# Patient Record
Sex: Female | Born: 1950 | Race: White | Hispanic: No | Marital: Single | Smoking: Never smoker
Health system: Southern US, Community
[De-identification: ages and names within clinical notes are randomized; demographics above are authoritative.]

## PROBLEM LIST (undated history)

## (undated) ENCOUNTER — Emergency Department (HOSPITAL_COMMUNITY)

## (undated) DIAGNOSIS — C4491 Basal cell carcinoma of skin, unspecified: Secondary | ICD-10-CM

## (undated) DIAGNOSIS — Z464 Encounter for fitting and adjustment of orthodontic device: Secondary | ICD-10-CM

## (undated) DIAGNOSIS — F419 Anxiety disorder, unspecified: Secondary | ICD-10-CM

## (undated) DIAGNOSIS — Z86018 Personal history of other benign neoplasm: Secondary | ICD-10-CM

## (undated) DIAGNOSIS — Z973 Presence of spectacles and contact lenses: Secondary | ICD-10-CM

## (undated) DIAGNOSIS — R7303 Prediabetes: Secondary | ICD-10-CM

## (undated) DIAGNOSIS — M722 Plantar fascial fibromatosis: Secondary | ICD-10-CM

## (undated) DIAGNOSIS — IMO0001 Reserved for inherently not codable concepts without codable children: Secondary | ICD-10-CM

## (undated) DIAGNOSIS — I10 Essential (primary) hypertension: Secondary | ICD-10-CM

## (undated) DIAGNOSIS — T7840XA Allergy, unspecified, initial encounter: Secondary | ICD-10-CM

## (undated) DIAGNOSIS — C44612 Basal cell carcinoma of skin of right upper limb, including shoulder: Secondary | ICD-10-CM

## (undated) DIAGNOSIS — K589 Irritable bowel syndrome without diarrhea: Secondary | ICD-10-CM

## (undated) DIAGNOSIS — M8580 Other specified disorders of bone density and structure, unspecified site: Secondary | ICD-10-CM

## (undated) HISTORY — PX: UPPER GI ENDOSCOPY: SHX6162

## (undated) HISTORY — DX: Plantar fascial fibromatosis: M72.2

## (undated) HISTORY — PX: OTHER SURGICAL HISTORY: SHX169

## (undated) HISTORY — DX: Basal cell carcinoma of skin, unspecified: C44.91

## (undated) HISTORY — PX: COLONOSCOPY: SHX174

## (undated) HISTORY — DX: Personal history of other benign neoplasm: Z86.018

## (undated) HISTORY — DX: Allergy, unspecified, initial encounter: T78.40XA

## (undated) HISTORY — DX: Irritable bowel syndrome, unspecified: K58.9

## (undated) HISTORY — DX: Other specified disorders of bone density and structure, unspecified site: M85.80

## (undated) HISTORY — PX: EYE SURGERY: SHX253

## (undated) HISTORY — DX: Basal cell carcinoma of skin of right upper limb, including shoulder: C44.612

---

## 2005-04-23 ENCOUNTER — Encounter: Payer: Self-pay | Admitting: Rheumatology

## 2005-04-29 ENCOUNTER — Encounter: Payer: Self-pay | Admitting: Rheumatology

## 2006-09-29 HISTORY — PX: RHINOPLASTY: SUR1284

## 2007-01-28 HISTORY — PX: FRACTURE SURGERY: SHX138

## 2007-02-15 ENCOUNTER — Emergency Department: Payer: Self-pay | Admitting: Emergency Medicine

## 2007-03-29 ENCOUNTER — Ambulatory Visit: Payer: Self-pay | Admitting: Otolaryngology

## 2007-04-29 ENCOUNTER — Ambulatory Visit: Payer: Self-pay | Admitting: Otolaryngology

## 2008-04-27 DIAGNOSIS — C44612 Basal cell carcinoma of skin of right upper limb, including shoulder: Secondary | ICD-10-CM

## 2008-04-27 HISTORY — DX: Basal cell carcinoma of skin of right upper limb, including shoulder: C44.612

## 2009-06-21 DIAGNOSIS — Z86018 Personal history of other benign neoplasm: Secondary | ICD-10-CM

## 2009-06-21 HISTORY — DX: Personal history of other benign neoplasm: Z86.018

## 2010-11-30 ENCOUNTER — Ambulatory Visit: Payer: Self-pay | Admitting: Internal Medicine

## 2011-05-13 ENCOUNTER — Ambulatory Visit: Payer: Self-pay

## 2011-06-13 ENCOUNTER — Ambulatory Visit: Payer: Self-pay | Admitting: Internal Medicine

## 2014-03-20 ENCOUNTER — Ambulatory Visit: Payer: Self-pay | Admitting: Surgery

## 2014-03-20 HISTORY — PX: BREAST BIOPSY: SHX20

## 2014-03-21 LAB — PATHOLOGY REPORT

## 2014-04-06 ENCOUNTER — Ambulatory Visit: Payer: Self-pay | Admitting: Unknown Physician Specialty

## 2014-04-07 LAB — PATHOLOGY REPORT

## 2014-07-13 DIAGNOSIS — M791 Myalgia, unspecified site: Secondary | ICD-10-CM | POA: Insufficient documentation

## 2015-02-23 ENCOUNTER — Other Ambulatory Visit: Payer: Self-pay | Admitting: Unknown Physician Specialty

## 2015-02-23 DIAGNOSIS — Z1231 Encounter for screening mammogram for malignant neoplasm of breast: Secondary | ICD-10-CM

## 2015-03-19 ENCOUNTER — Ambulatory Visit
Admission: RE | Admit: 2015-03-19 | Discharge: 2015-03-19 | Disposition: A | Discharge status: Home / Self Care | Payer: BC Managed Care – PPO | Source: Ambulatory Visit | Attending: Unknown Physician Specialty | Admitting: Unknown Physician Specialty

## 2015-03-19 DIAGNOSIS — Z1231 Encounter for screening mammogram for malignant neoplasm of breast: Secondary | ICD-10-CM | POA: Insufficient documentation

## 2015-03-27 ENCOUNTER — Telehealth: Payer: Self-pay | Admitting: *Deleted

## 2015-03-27 NOTE — Telephone Encounter (Signed)
Patient called. pt inquired what to expect for tomorrow's NP visit. Explained to patient that Dr. Theora Gianotti will evaluate her with a gyn examination to determine the cause of her vulvar pain. The patient asked if she would be undergoing a biopsy in the clinic tomorrow and have anesthesia. I explained to the patient that we must examine the patient first to determine if a biopsy is even warranted.  However, I reassured the patient will not have anesthesia tomorrow.  Pt reassured. Support and active listening provided.

## 2015-03-28 ENCOUNTER — Other Ambulatory Visit: Payer: Self-pay | Admitting: Family Medicine

## 2015-03-28 ENCOUNTER — Inpatient Hospital Stay: Payer: BC Managed Care – PPO | Attending: Obstetrics and Gynecology | Admitting: Obstetrics and Gynecology

## 2015-03-28 VITALS — BP 139/82 | HR 67 | Temp 97.7°F | Resp 18 | Ht 62.0 in | Wt 123.0 lb

## 2015-03-28 DIAGNOSIS — N94819 Vulvodynia, unspecified: Secondary | ICD-10-CM | POA: Diagnosis not present

## 2015-03-28 DIAGNOSIS — R102 Pelvic and perineal pain unspecified side: Secondary | ICD-10-CM | POA: Insufficient documentation

## 2015-03-28 DIAGNOSIS — M8580 Other specified disorders of bone density and structure, unspecified site: Secondary | ICD-10-CM | POA: Diagnosis not present

## 2015-03-28 DIAGNOSIS — Z85828 Personal history of other malignant neoplasm of skin: Secondary | ICD-10-CM | POA: Diagnosis not present

## 2015-03-28 DIAGNOSIS — N9089 Other specified noninflammatory disorders of vulva and perineum: Secondary | ICD-10-CM | POA: Diagnosis not present

## 2015-03-28 DIAGNOSIS — Z79899 Other long term (current) drug therapy: Secondary | ICD-10-CM | POA: Diagnosis not present

## 2015-03-28 MED ORDER — LIDOCAINE HCL 2 % EX GEL: 1.0000 "application " | CUTANEOUS | Status: DC | PRN

## 2015-03-28 MED ORDER — LIDOCAINE 5 % EX OINT: 1.0000 "application " | TOPICAL_OINTMENT | Freq: Four times a day (QID) | CUTANEOUS | Status: DC | PRN

## 2015-03-28 MED ORDER — ESTRADIOL 0.1 MG/GM VA CREA: TOPICAL_CREAM | VAGINAL | Status: DC

## 2015-03-28 MED ORDER — CEPHALEXIN 500 MG PO CAPS: 500.0000 mg | ORAL_CAPSULE | Freq: Four times a day (QID) | ORAL | Status: AC

## 2015-03-28 NOTE — Progress Notes (Signed)
Gynecologic Oncology Interval Note  Referring Provider: Verlene Mayer, MD  Chief Concern: Vulvar pain  Subjective:  Tiffany [name] is a 64 y.o. woman who presents today for vulvar pain. She was seen by Dr. Laurey Morale who did a Pap and vaginal swab. Her referred her for further evaluation.   She has tried diflucan, premarin cream (stopped due to breast soreness), vaseline, Replens, soaking in baking soda, vitamin E topical, and A&D ointment.   Problem List: Patient Active Problem List   Diagnosis Date Noted  . Vulvar pain 03/28/2015    Past Medical History: Past Medical History  Diagnosis Date  . Basal cell carcinoma   . Osteopenia   . Allergy   . Plantar fasciitis     Past Surgical History: Past Surgical History  Procedure Laterality Date  . Breast biopsy Right     negative  . Rhinoplasty  2008    For fractured nose    Family History: Family History  Problem Relation Age of Onset  . Colon cancer Mother   . Hypertension Mother   . Basal cell carcinoma Mother   . Diabetes Father   . Hypertension Father   . Heart disease Father   . Alzheimer's disease Father     Social History: History   Social History  . Marital Status: Single    Spouse Name: N/A  . Number of Children: N/A  . Years of Education: N/A   Occupational History  . Not on file.   Social History Main Topics  . Smoking status: Never Smoker   . Smokeless tobacco: Never Used  . Alcohol Use: No  . Drug Use: No  . Sexual Activity: No   Other Topics Concern  . Not on file   Social History Narrative   Past OB/GYN history: G0P0. Postmenopausal.   Allergies: Allergies  Allergen Reactions  . Prednisone Other (See Comments)    Body aches with oral prednisone  . Sulfa Antibiotics Nausea Only    Current Medications: Current Outpatient Prescriptions  Medication Sig Dispense Refill  . ALPRAZolam (XANAX) 0.25 MG tablet Take 0.5 tablets by mouth as needed.    . Ascorbic Acid (VITAMIN C) 1000  MG tablet Take 1 tablet by mouth daily.    . Biotin 1000 MCG tablet Take 1 tablet by mouth daily.    Marland Kitchen bismuth subsalicylate (BISMATROL) 262 MG/15ML suspension Take by mouth as needed.    . calcium citrate-vitamin D (CITRACAL+D) 315-200 MG-UNIT per tablet Take by mouth.    . cephALEXin (KEFLEX) 500 MG capsule Take 1 capsule (500 mg total) by mouth 4 (four) times daily. 8 capsule 0  . Clocortolone Pivalate (CLODERM) 0.1 % cream Apply topically as needed.    . diphenhydrAMINE (BENADRYL) 25 mg capsule Take 1 capsule by mouth as needed.    . fluconazole (DIFLUCAN) 150 MG tablet Take 1 tablet by mouth as needed.    . magnesium 30 MG tablet Take 250 mg by mouth 1 day or 1 dose.    . Multiple Vitamin (MULTIVITAMIN) capsule Take 1 capsule by mouth daily.    . naproxen sodium (RA NAPROXEN SODIUM) 220 MG tablet Take 1 tablet by mouth as needed.    . phenylephrine-shark liver oil-mineral oil-petrolatum (PREPARATION H) 0.25-3-14-71.9 % rectal ointment Place 1 application rectally 2 (two) times daily as needed for hemorrhoids.    . vitamin E 400 UNIT capsule Apply 400 Units topically as needed.    Marland Kitchen estradiol (ESTRACE VAGINAL) 0.1 MG/GM vaginal cream Apply to the  vulvar area twice a day until healed. 42.5 g 0   Current Facility-Administered Medications  Medication Dose Route Frequency Provider Last Rate Last Dose  . lidocaine (XYLOCAINE) 2 % jelly 1 application  1 application Topical PRN Laramie Meissner Gaetana Michaelis, MD          Review of Systems A comprehensive review of systems was negative. Except for vulvar pain as noted in HPI  Objective:  Physical Examination:  BP 139/82 mmHg  Pulse 67  Temp(Src) 97.7 F (36.5 C) (Tympanic)  Resp 18  Ht 5\' 2"  (1.575 m)  Wt 123 lb 0.3 oz (55.8 kg)  BMI 22.49 kg/m2  ECOG Performance Status: 1 - Symptomatic but completely ambulatory  General appearance: alert, cooperative and appears stated age HEENT:PERRLA, extra ocular movement intact and sclera clear,  anicteric Lymph node survey: non-palpable, inguinal Extremities: extremities normal, atraumatic, no cyanosis or edema Neurological exam reveals alert, oriented, normal speech, no focal findings or movement disorder noted.  Pelvic: exam chaperoned by nurse;  Vulva: vulvar erythema extensive with fusion of the upper labia for at least 1.5 cm, unable to visualize the urethra. No lesions; Vagina: not evaluated the os was limited to do the labial fusion; BME not performed.   The risks and benefits of the procedure were reviewed and informed consent obtained. Time out was performed. The patient received pre-procedure teaching and expressed understanding. The post-procedure instructions were reviewed with the patient and she expressed understanding. The patient does not have any barriers to learning.  Colposcopy performed after application of acetic acid. AWE noted on labia minora from 2-5 o'clock, 8-11 o'clock. The cells are not raised so dysplasia unlikely.   Area treated with lidocaine jelly for topical anesthesia and we waited at least 5 minutes before proceeding. Further anesthesia was obtained with 2% lidocaine 7 cc. The site was cleansed with Betadine. The fused labia were released gently. Biopsy of the vulva at 3 o'clock performed. Hemostasis excellent with AgNO3. Patient reassessed after procedure and in stable condition. No complications.    Lab Review Biopsy pending.   Assessment:   ABRIAL ARRIGHI is a 64 y.o. 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   Plan:   Problem List Items Addressed This Visit      Other   Vulvar pain    Other Visit Diagnoses    Vulvar lesion    -  Primary    Relevant Medications    cephALEXin (KEFLEX) 500 MG capsule    estradiol (ESTRACE VAGINAL) 0.1 MG/GM vaginal cream    lidocaine (XYLOCAINE) 2 % jelly 1 application    Other Relevant Orders    Surgical pathology       Reassurance given. Follow up biopsy from today.    She was given antibiotic prophylaxis, estrogen cream for prevention of further labial fusion, and lidocaine jelly for pain control.   Gillis Ends, MD  CC:  Rosina Lowenstein, MD [address] [place], [place] [address] [phone]

## 2015-03-28 NOTE — Patient Instructions (Addendum)
Biopsy Care After Refer to this sheet in the next few weeks. These instructions provide you with information on caring for yourself after your procedure. Your caregiver may also give you more specific instructions. Your treatment has been planned according to current medical practices, but problems sometimes occur. Call your caregiver if you have any problems or questions after your procedure.  If you had a vulvar biopsy, you may have soreness at the biopsy site for 1 to 2 days.  HOME CARE INSTRUCTIONS   You may resume normal diet and activities as directed.  No bandages (dressings) as required.   Only take over-the-counter or prescription medicines for pain, discomfort, or fever as directed by your caregiver.  You can bathe and get your wound wet. In fact for the next 3 days do sitz baths twice a day. Then afterwards once a day until healed.   SEEK IMMEDIATE MEDICAL CARE IF:   You have increased bleeding (more than a small spot) from the biopsy site.  You notice redness, swelling, or increasing pain at the biopsy site.  You have purulent discharge.   You have a fever.  You have a rash, have difficulty breathing, or have any allergic problems. MAKE SURE YOU:   Understand these instructions.  Will watch your condition.  Will get help right away if you are not doing well or get worse. Document Released: 04/04/2005 Document Revised: 12/08/2011 Document Reviewed: 03/13/2011 [facility] Patient Information 2015 [place], [place]. This information is not intended to replace advice given to you by your health care provider. Make sure you discuss any questions you have with your health care provider.  Lichen Sclerosus Lichen sclerosus is a skin problem. It can happen on any part of the body, but it commonly involves the anal or genital areas. Lichen sclerosus is not an infection or a fungus. Girls and women are more commonly affected than boys and men. CAUSES The cause is not known. It could  be the result of an overactive immune system or a lack of certain hormones. Lichen sclerosus is not passed from one person to another (not contagious). SYMPTOMS Your skin may have:  Thin, wrinkled, white areas.  Thickened white areas.  Red and swollen patches.  Tears or cracks.  Bruising.  Blood blisters.  Severe itching. You may also have pain, itching, or burning with urination. Constipation is also common in people with lichen sclerosus. DIAGNOSIS Your caregiver will do a physical exam. Sometimes, a tissue sample (biopsy) may be sent for testing. TREATMENT Treatment may involve putting a thin layer of medicated cream (topical steroid) over the areas with lichen sclerosus. Use the cream only as directed by your caregiver.  HOME CARE INSTRUCTIONS  Only take over-the-counter or prescription medicines as directed by your caregiver.  Keep the vaginal area as clean and dry as possible. SEEK MEDICAL CARE IF: You develop increasing pain, swelling, or redness. Document Released: 02/05/2011 Document Revised: 12/08/2011 Document Reviewed: 02/05/2011 [facility] Patient Information 2015 [place], [place]. This information is not intended to replace advice given to you by your health care provider. Make sure you discuss any questions you have with your health care provider.  Cephalexin tablets or capsules What is this medicine? CEPHALEXIN (sef a LEX in) is a cephalosporin antibiotic. It is used to treat certain kinds of bacterial infections It will not work for colds, flu, or other viral infections. This medicine may be used for other purposes; ask your health care provider or pharmacist if you have questions. COMMON BRAND NAME(S):  Biocef, Keflex, Keftab What should I tell my health care provider before I take this medicine? They need to know if you have any of these conditions: -kidney disease -stomach or intestine problems, especially colitis -an unusual or allergic reaction to  cephalexin, other cephalosporins, penicillins, other antibiotics, medicines, foods, dyes or preservatives -pregnant or trying to get pregnant -breast-feeding How should I use this medicine? Take this medicine by mouth with a full glass of water. Follow the directions on the prescription label. This medicine can be taken with or without food. Take your medicine at regular intervals. Do not take your medicine more often than directed. Take all of your medicine as directed even if you think you are better. Do not skip doses or stop your medicine early. Talk to your pediatrician regarding the use of this medicine in children. While this drug may be prescribed for selected conditions, precautions do apply. Overdosage: If you think you have taken too much of this medicine contact a poison control center or emergency room at once. NOTE: This medicine is only for you. Do not share this medicine with others. What if I miss a dose? If you miss a dose, take it as soon as you can. If it is almost time for your next dose, take only that dose. Do not take double or extra doses. There should be at least 4 to 6 hours between doses. What may interact with this medicine? -probenecid -some other antibiotics This list may not describe all possible interactions. Give your health care provider a list of all the medicines, herbs, non-prescription drugs, or dietary supplements you use. Also tell them if you smoke, drink alcohol, or use illegal drugs. Some items may interact with your medicine. What should I watch for while using this medicine? Tell your doctor or health care professional if your symptoms do not begin to improve in a few days. Do not treat diarrhea with over the counter products. Contact your doctor if you have diarrhea that lasts more than 2 days or if it is severe and watery. If you have diabetes, you may get a false-positive result for sugar in your urine. Check with your doctor or health care  professional. What side effects may I notice from receiving this medicine? Side effects that you should report to your doctor or health care professional as soon as possible: -allergic reactions like skin rash, itching or hives, swelling of the face, lips, or tongue -breathing problems -pain or trouble passing urine -redness, blistering, peeling or loosening of the skin, including inside the mouth -severe or watery diarrhea -unusually weak or tired -yellowing of the eyes, skin Side effects that usually do not require medical attention (report to your doctor or health care professional if they continue or are bothersome): -gas or heartburn -genital or anal irritation -headache -joint or muscle pain -nausea, vomiting This list may not describe all possible side effects. Call your doctor for medical advice about side effects. You may report side effects to FDA at 1-800-FDA-1088. Where should I keep my medicine? Keep out of the reach of children. Store at room temperature between 59 and 86 degrees F (15 and 30 degrees C). Throw away any unused medicine after the expiration date. NOTE: This sheet is a summary. It may not cover all possible information. If you have questions about this medicine, talk to your doctor, pharmacist, or health care provider.  2015, Elsevier/Gold Standard. (2007-12-20 17:09:13)  Lidocaine jelly What is this medicine? LIDOCAINE (LYE doe Gwenlyn Saran) is an  anesthetic. It causes loss of feeling in the skin and surrounding tissues. It is used to prevent and to treat pain from some procedures. This medicine may be used for other purposes; ask your health care provider or pharmacist if you have questions. COMMON BRAND NAME(S): Anestacon, Glydo, LidoRx, Xylocaine What should I tell my health care provider before I take this medicine? They need to know if you have any of these conditions: -infected, open or damaged skin -an unusual or allergic reaction to lidocaine, other  medicines, foods, dyes, or preservatives -pregnant or trying to get pregnant -breast-feeding How should I use this medicine? This medicine is applied to the skin or mucous membranes using finger tips or cotton swabs. It may be applied by a health care professional before a procedure to numb the area. It may also be applied to hemorrhoids for relief of pain. Follow the directions on the prescription label. Do not use more often than directed. Talk to your pediatrician regarding the use of this medicine in children. While this drug may be prescribed for selected conditions, precautions do apply. Overdosage: If you think you have taken too much of this medicine contact a poison control center or emergency room at once. NOTE: This medicine is only for you. Do not share this medicine with others. What if I miss a dose? This does not apply. What may interact with this medicine? -medicines to control heart rhythm. Do not use any other skin products on the affected area without asking your doctor or health care professional. This list may not describe all possible interactions. Give your health care provider a list of all the medicines, herbs, non-prescription drugs, or dietary supplements you use. Also tell them if you smoke, drink alcohol, or use illegal drugs. Some items may interact with your medicine. What should I watch for while using this medicine? Be careful to avoid injury while the area is numb and you are not aware of pain. If this medicine is used in the mouth or throat, do not chew gum or eat food for at least one hour. If the area is still numb, you may choke or bite your tongue or cheek if you try to chew or swallow. Also, you may not feel pain from hot foods or drinks. Do not apply this medicine to areas of skin that are infected, open, or damaged. This may increase the amount of medicine that passes through your skin and increase the risk of serious side effects. What side effects may I  notice from receiving this medicine? Side effects that you should report to your doctor or health care professional as soon as possible: -allergic reactions like skin rash, itching or hives, swelling of the face, lips, or tongue -breathing problems -chest pain, continued irregular heartbeats -headache -seizures -trembling, shaking -unusually weak or tired Side effects that usually do not require medical attention (report to your doctor or health care professional if they continue or are bothersome): -localized numbness This list may not describe all possible side effects. Call your doctor for medical advice about side effects. You may report side effects to FDA at 1-800-FDA-1088. Where should I keep my medicine? Keep out of reach of children. Store at room temperature between 15 and 30 degrees C (59 and 86 degrees F). Do not freeze. Throw away any unused medicine after the expiration date. NOTE: This sheet is a summary. It may not cover all possible information. If you have questions about this medicine, talk to your doctor, pharmacist, or  health care provider.  2015, Elsevier/Gold Standard. (2007-12-02 10:54:13)  Estradiol vaginal cream What is this medicine? ESTRADIOL (es tra DYE ole) contains the female hormone estrogen. It is used for symptoms of menopause, like vaginal dryness and irritation. This medicine may be used for other purposes; ask your health care provider or pharmacist if you have questions. COMMON BRAND NAME(S): Estrace What should I tell my health care provider before I take this medicine? They need to know if you have any of these conditions: -abnormal vaginal bleeding -blood vessel disease or blood clots -breast, cervical, endometrial, ovarian, liver, or uterine cancer -dementia -diabetes -gallbladder disease -heart disease or recent heart attack -high blood pressure -high cholesterol -high levels of calcium in the blood -hysterectomy -kidney disease -liver  disease -migraine headaches -protein C deficiency -protein S deficiency -stroke -systemic lupus erythematosus (SLE) -tobacco smoker -an unusual or allergic reaction to estrogens, other hormones, soy, other medicines, foods, dyes, or preservatives -pregnant or trying to get pregnant -breast-feeding How should I use this medicine? This medicine is for use in the vagina only. Do not take by mouth. Follow the directions on the prescription label. Read package directions carefully before using. Use the special applicator supplied with the cream. Wash hands before and after use. Fill the applicator with the prescribed amount of cream. Lie on your back, part and bend your knees. Insert the applicator into the vagina and push the plunger to expel the cream into the vagina. Wash the applicator with warm soapy water and rinse well. Use exactly as directed for the complete length of time prescribed. Do not stop using except on the advice of your doctor or health care professional. A patient package insert for the product will be given with each prescription and refill. Read this sheet carefully each time. The sheet may change frequently. Talk to your pediatrician regarding the use of this medicine in children. This medicine is not approved for use in children. Overdosage: If you think you have taken too much of this medicine contact a poison control center or emergency room at once. NOTE: This medicine is only for you. Do not share this medicine with others. What if I miss a dose? If you miss a dose, use it as soon as you can. If it is almost time for your next dose, use only that dose. Do not use double or extra doses. What may interact with this medicine? Do not take this medicine with any of the following medications: -aromatase inhibitors like aminoglutethimide, anastrozole, exemestane, letrozole, testolactone This medicine may also interact with the following medications: -barbiturates used for  inducing sleep or treating seizures -carbamazepine -grapefruit juice -medicines for fungal infections like ketoconazole and itraconazole -raloxifene -rifabutin -rifampin -rifapentine -ritonavir -some antibiotics used to treat infections -St. John's Wort -tamoxifen -warfarin This list may not describe all possible interactions. Give your health care provider a list of all the medicines, herbs, non-prescription drugs, or dietary supplements you use. Also tell them if you smoke, drink alcohol, or use illegal drugs. Some items may interact with your medicine. What should I watch for while using this medicine? Visit your health care professional for regular checks on your progress. You will need a regular breast and pelvic exam. You should also discuss the need for regular mammograms with your health care professional, and follow his or her guidelines. This medicine can make your body retain fluid, making your fingers, hands, or ankles swell. Your blood pressure can go up. Contact your doctor or health care  professional if you feel you are retaining fluid. If you have any reason to think you are pregnant, stop taking this medicine at once and contact your doctor or health care professional. Tobacco smoking increases the risk of getting a blood clot or having a stroke, especially if you are more than 63 years old. You are strongly advised not to smoke. If you wear contact lenses and notice visual changes, or if the lenses begin to feel uncomfortable, consult your eye care specialist. If you are going to have elective surgery, you may need to stop taking this medicine beforehand. Consult your health care professional for advice prior to scheduling the surgery. What side effects may I notice from receiving this medicine? Side effects that you should report to your doctor or health care professional as soon as possible: -allergic reactions like skin rash, itching or hives, swelling of the face, lips,  or tongue -breast tissue changes or discharge -changes in vision -chest pain -confusion, trouble speaking or understanding -dark urine -general ill feeling or flu-like symptoms -light-colored stools -nausea, vomiting -pain, swelling, warmth in the leg -right upper belly pain -severe headaches -shortness of breath -sudden numbness or weakness of the face, arm or leg -trouble walking, dizziness, loss of balance or coordination -unusual vaginal bleeding -yellowing of the eyes or skin Side effects that usually do not require medical attention (report to your doctor or health care professional if they continue or are bothersome): -hair loss -increased hunger or thirst -increased urination -symptoms of vaginal infection like itching, irritation or unusual discharge -unusually weak or tired This list may not describe all possible side effects. Call your doctor for medical advice about side effects. You may report side effects to FDA at 1-800-FDA-1088. Where should I keep my medicine? Keep out of the reach of children. Store at room temperature between 15 and 30 degrees C (59 and 86 degrees F). Protect from temperatures above 40 degrees C (104 degrees C). Do not freeze. Throw away any unused medicine after the expiration date. NOTE: This sheet is a summary. It may not cover all possible information. If you have questions about this medicine, talk to your doctor, pharmacist, or health care provider.  2015, Elsevier/Gold Standard. (2010-12-18 09:18:12)

## 2015-04-03 LAB — SURGICAL PATHOLOGY

## 2015-04-04 ENCOUNTER — Encounter: Payer: Self-pay | Admitting: *Deleted

## 2015-04-04 NOTE — Progress Notes (Signed)
RN followed up on the need for a Prior auth for the estrace vaginal cream 0.1 mg/gm for vulvar pain. 1 app. Twice a day until healed. Spoke with [name] at [facility] [employer] at [phone].  AT this time, no prior Josem Kaufmann is needed per insurance. The patient is able to pick up RX.

## 2015-04-09 ENCOUNTER — Other Ambulatory Visit: Payer: Self-pay | Admitting: Obstetrics and Gynecology

## 2015-04-09 ENCOUNTER — Telehealth: Payer: Self-pay | Admitting: Obstetrics and Gynecology

## 2015-04-09 DIAGNOSIS — N762 Acute vulvitis: Secondary | ICD-10-CM

## 2015-04-09 MED ORDER — CLOBETASOL PROPIONATE 0.05 % EX OINT: 1.0000 "application " | TOPICAL_OINTMENT | Freq: Two times a day (BID) | CUTANEOUS | Status: DC

## 2015-04-09 NOTE — Telephone Encounter (Signed)
I contacted Tiffany [name] via telephone regarding her pathology results. She would like to have a referral to [facility] who specializes in gynecologic dermatopathologies. I spoke with Tiffany [name] and she is happy to see her. She provided her office number to make an appointment. She also recommended clobetasol 0.05% ointment BID for 2 weeks then decrease to daily application. Tiffany [name] was appreciative of this information and the referral.  Gillis Ends, MD

## 2015-04-10 ENCOUNTER — Telehealth: Payer: Self-pay | Admitting: *Deleted

## 2015-04-10 NOTE — Telephone Encounter (Signed)
-----   Message from Nice, MD sent at 04/09/2015  2:01 PM EDT ----- Nira Conn,  Can you please send Ms. Gruel's consult note and pathology results to Dr. Susy Manor.  See info below.  Thank you Sincerely [place], [facility]      Primary Contact Information   Phone  [phone] Fax  [phone]  E-mail  [email or web address]  Address  [address]  [address]  [place] [address]

## 2015-04-10 NOTE — Telephone Encounter (Signed)
Dr. Gershon Crane note faxed to Dr. Susy Manor as directed by md.

## 2016-02-08 ENCOUNTER — Other Ambulatory Visit: Payer: Self-pay | Admitting: Unknown Physician Specialty

## 2016-02-08 DIAGNOSIS — Z1231 Encounter for screening mammogram for malignant neoplasm of breast: Secondary | ICD-10-CM

## 2016-03-19 ENCOUNTER — Ambulatory Visit
Admission: RE | Admit: 2016-03-19 | Discharge: 2016-03-19 | Disposition: A | Discharge status: Home / Self Care | Payer: BC Managed Care – PPO | Source: Ambulatory Visit | Attending: Unknown Physician Specialty | Admitting: Unknown Physician Specialty

## 2016-03-19 ENCOUNTER — Other Ambulatory Visit: Payer: Self-pay | Admitting: Unknown Physician Specialty

## 2016-03-19 DIAGNOSIS — Z1231 Encounter for screening mammogram for malignant neoplasm of breast: Secondary | ICD-10-CM | POA: Insufficient documentation

## 2016-04-22 ENCOUNTER — Other Ambulatory Visit: Payer: Self-pay | Admitting: Cardiovascular Disease

## 2016-04-22 DIAGNOSIS — M8580 Other specified disorders of bone density and structure, unspecified site: Secondary | ICD-10-CM

## 2016-05-19 ENCOUNTER — Ambulatory Visit
Admission: RE | Admit: 2016-05-19 | Discharge: 2016-05-19 | Disposition: A | Discharge status: Home / Self Care | Payer: BC Managed Care – PPO | Source: Ambulatory Visit | Attending: Cardiovascular Disease | Admitting: Cardiovascular Disease

## 2016-05-19 DIAGNOSIS — M810 Age-related osteoporosis without current pathological fracture: Secondary | ICD-10-CM | POA: Diagnosis not present

## 2016-05-19 DIAGNOSIS — Z8262 Family history of osteoporosis: Secondary | ICD-10-CM | POA: Diagnosis not present

## 2016-05-19 DIAGNOSIS — M8580 Other specified disorders of bone density and structure, unspecified site: Secondary | ICD-10-CM

## 2016-05-19 DIAGNOSIS — Z780 Asymptomatic menopausal state: Secondary | ICD-10-CM | POA: Diagnosis not present

## 2016-05-19 DIAGNOSIS — M899 Disorder of bone, unspecified: Secondary | ICD-10-CM | POA: Diagnosis not present

## 2016-07-10 ENCOUNTER — Other Ambulatory Visit: Payer: Self-pay

## 2016-07-10 DIAGNOSIS — N390 Urinary tract infection, site not specified: Secondary | ICD-10-CM

## 2016-07-11 ENCOUNTER — Encounter: Payer: Self-pay | Admitting: Urology

## 2016-07-11 ENCOUNTER — Ambulatory Visit (INDEPENDENT_AMBULATORY_CARE_PROVIDER_SITE_OTHER): Payer: BC Managed Care – PPO | Admitting: Urology

## 2016-07-11 ENCOUNTER — Other Ambulatory Visit
Admission: RE | Admit: 2016-07-11 | Discharge: 2016-07-11 | Disposition: A | Discharge status: Home / Self Care | Payer: BC Managed Care – PPO | Source: Ambulatory Visit | Attending: Urology | Admitting: Urology

## 2016-07-11 VITALS — BP 141/71 | HR 62 | Ht 61.0 in | Wt 118.0 lb

## 2016-07-11 DIAGNOSIS — N952 Postmenopausal atrophic vaginitis: Secondary | ICD-10-CM

## 2016-07-11 DIAGNOSIS — N895 Stricture and atresia of vagina: Secondary | ICD-10-CM

## 2016-07-11 DIAGNOSIS — N390 Urinary tract infection, site not specified: Secondary | ICD-10-CM | POA: Insufficient documentation

## 2016-07-11 LAB — URINALYSIS COMPLETE WITH MICROSCOPIC (ARMC ONLY)
BACTERIA UA: NONE SEEN
Bilirubin Urine: NEGATIVE
Glucose, UA: NEGATIVE mg/dL
Hgb urine dipstick: NEGATIVE
Ketones, ur: NEGATIVE mg/dL
Leukocytes, UA: NEGATIVE
Nitrite: NEGATIVE
PROTEIN: NEGATIVE mg/dL
RBC / HPF: NONE SEEN RBC/hpf (ref 0–5)
SPECIFIC GRAVITY, URINE: 1.015 (ref 1.005–1.030)
Squamous Epithelial / LPF: NONE SEEN
pH: 7.5 (ref 5.0–8.0)

## 2016-07-11 LAB — BLADDER SCAN AMB NON-IMAGING: Scan Result: 0

## 2016-07-11 NOTE — Progress Notes (Signed)
07/11/2016 5:29 PM   Tiffany [name] January 22, 1951 [id number]  Referring provider: Gae Dry, MD [address] [place], [place] [address]  Chief Complaint  Patient presents with  . New Patient (Initial Visit)    recurrent UTI    HPI: 65 year old female referred for further workup of recurrent urinary tract infections.  She reports that she has been treated 5 times over the past year by her Ob GYN Dr.: Dr. Kenton Kingfisher. She states that her symptoms at the time of infection include lower pelvic discomfort, urinary frequency, and urgency. She denies any dysuria or gross hematuria. She also notes that her urine will become cloudy at times. During her last infection, her only symptom was cloudy urine. Unfortunate, no documentation of previous urinary tract infections was faxed today with the referral. She reports that she believes that these were Escherichia coli infections.  Prior to year ago, she's only had one other urinary tract infection in her life.  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  She is status post labial biopsy showed chronic inflammation but no evidence of lichen sclerosis. She has been using vaginal dilators once a week x 1 year. She also has been using vaginal estrogen cream 3 times a week for great than one year.  She does wash her hands really well before applying this medication and cleans her vaginal dilators with soapy water.  She does wipe front to back.  No bowel issues. She does occasionally have constipation.  She takes probiotics on a regular basis. She considered cranberry tabs and occasionally takes 1 in the morning but not prophylactically.  Her UA today is completely negative, without evidence of microscopic hematuria or any other signs  of infection.  She does occasionally feel that she does not completely empty her bladder. Initially when she presented to the office, she had provided a urinary sample  in the lab but still had around 150 cc in her bladder. She felt like she could void and empty with repeat PVR of 0.  PMH: Past Medical History:  Diagnosis Date  . Allergy   . Basal cell carcinoma   . Osteopenia   . Plantar fasciitis     Surgical History: Past Surgical History:  Procedure Laterality Date  . BREAST BIOPSY Right 03/20/14   negative  . dental implants    . RHINOPLASTY  2008   For fractured nose    Home Medications:    Medication List       Accurate as of 07/11/16  5:29 PM. Always use your most recent med list.          ALPRAZolam 0.25 MG tablet Commonly known as:  XANAX Take 0.5 tablets by mouth as needed.   Biotin 1000 MCG tablet Take 1 tablet by mouth daily.   BISMATROL 262 MG/15ML suspension Generic drug:  bismuth subsalicylate Take by mouth as needed.   calcium citrate-vitamin D 315-200 MG-UNIT tablet Commonly known as:  CITRACAL+D Take by mouth.   CLODERM 0.1 % cream Generic drug:  Clocortolone Pivalate Apply topically as needed.   estradiol 0.1 MG/GM vaginal cream Commonly known as:  ESTRACE VAGINAL Apply to the vulvar area twice a day until healed.   magnesium 30 MG tablet Take 250 mg by mouth 1 day or 1 dose.   multivitamin capsule Take 1 capsule by mouth daily.   phenylephrine-shark liver oil-mineral oil-petrolatum 0.25-3-14-71.9 % rectal ointment Commonly known as:  PREPARATION H Place 1  application rectally 2 (two) times daily as needed for hemorrhoids.   RA NAPROXEN SODIUM 220 MG tablet Generic drug:  naproxen sodium Take 1 tablet by mouth as needed.   vitamin C 1000 MG tablet Take 1 tablet by mouth daily.   vitamin E 400 UNIT capsule Apply 400 Units topically as needed.       Allergies:  Allergies  Allergen Reactions  . Prednisone Other (See Comments)    Body aches with oral prednisone  . Sulfa Antibiotics Nausea Only    Family History: Family History  Problem Relation Age of Onset  . Colon cancer Mother    . Hypertension Mother   . Basal cell carcinoma Mother   . Diabetes Father   . Hypertension Father   . Heart disease Father   . Alzheimer's disease Father   . Breast cancer Neg Hx     Social History:  reports that she has never smoked. She has never used smokeless tobacco. She reports that she does not drink alcohol or use drugs.  ROS: UROLOGY Frequent Urination?: No Hard to postpone urination?: No Burning/pain with urination?: No Get up at night to urinate?: No Leakage of urine?: No Urine stream starts and stops?: No Trouble starting stream?: No Do you have to strain to urinate?: No Blood in urine?: No Urinary tract infection?: Yes Sexually transmitted disease?: No Injury to kidneys or bladder?: No Painful intercourse?: No Weak stream?: No Currently pregnant?: No Vaginal bleeding?: No Last menstrual period?: n  Gastrointestinal Nausea?: No Vomiting?: No Indigestion/heartburn?: No Diarrhea?: No Constipation?: No  Constitutional Fever: No Night sweats?: No Weight loss?: No Fatigue?: No  Skin Skin rash/lesions?: No Itching?: No  Eyes Blurred vision?: No Double vision?: No  Ears/Nose/Throat Sore throat?: No Sinus problems?: No  Hematologic/Lymphatic Swollen glands?: No Easy bruising?: No  Cardiovascular Chest pain?: No  Respiratory Cough?: No Shortness of breath?: No  Endocrine Excessive thirst?: No  Musculoskeletal Back pain?: No Joint pain?: No  Neurological Headaches?: No Dizziness?: No  Psychologic Depression?: No Anxiety?: No  Physical Exam: BP (!) 141/71   Pulse 62   Ht 5\' 1"  (1.549 m)   Wt 118 lb (53.5 kg)   BMI 22.30 kg/m   Constitutional:  Alert and oriented, No acute distress. HEENT: [place] AT, moist mucus membranes.  Trachea midline, no masses. Cardiovascular: No clubbing, cyanosis, or edema. Respiratory: Normal respiratory effort, no increased work of breathing. GI: Abdomen is soft, nontender, nondistended, no  abdominal masses.  Thin.   GU: No CVA tenderness.  Skin: No rashes, bruises or suspicious lesions. Neurologic: Grossly intact, no focal deficits, moving all 4 extremities. Psychiatric: Normal mood and affect.  Laboratory Data: No recent BMP for review  No other urinalysis or urine cultures in the past here for review  Urinalysis Component     Latest Ref Rng & Units 07/11/2016  Color, Urine     YELLOW STRAW (A)  Appearance     CLEAR CLEAR  Glucose     NEGATIVE mg/dL NEGATIVE  Bilirubin Urine     NEGATIVE NEGATIVE  Ketones, ur     NEGATIVE mg/dL NEGATIVE  Specific Gravity, Urine     1.005 - 1.030 1.015  Hgb urine dipstick     NEGATIVE NEGATIVE  pH     5.0 - 8.0 7.5  Protein     NEGATIVE mg/dL NEGATIVE  Nitrite     NEGATIVE NEGATIVE  Leukocytes, UA     NEGATIVE NEGATIVE  RBC / HPF  0 - 5 RBC/hpf NONE SEEN  WBC, UA     0 - 5 WBC/hpf 0-5  Bacteria, UA     NONE SEEN NONE SEEN  Squamous Epithelial / LPF     NONE SEEN NONE SEEN   Pertinent Imaging: Results for orders placed or performed in visit on 07/11/16  Bladder Scan (Post Void Residual) in office  Result Value Ref Range   Scan Result 0     Assessment & Plan:    1. Recurrent UTI Suspect his symptoms are lower urinary tract in origin especially with her history of labial adhesions, atrophic vaginitis and vaginal stenosis Adequate bladder emtpying. Good hygiene was discussed at length today including wiping front to back and ensuring that vaginal dilators are clean Recommend continuation of probiotic, vaginal estrogen cream and resuming Carberry tablets twice daily I have advised her that should she develop signs or symptoms of urinary tract infection, she should call our office for same-day nurse visit appointment for a catheterized UA/urine culture, would like to start collecting urine culture data Discuss antibiotic stewardship and avoidance of chronic suppressive antibiotics if possible She was also  advised to double void to help optimize bladder emptying and hydration was encouraged - Bladder Scan (Post Void Residual) in office  2. Atrophic vaginitis Continue vaginal question cream  3. Vaginal stenosis As above    Return in about 6 months (around 01/09/2017) for recheck recurrent UTI.  Hollice Espy, MD  [facility] [address] [place], [place] [address] [phone]

## 2016-07-30 DIAGNOSIS — E7800 Pure hypercholesterolemia, unspecified: Secondary | ICD-10-CM | POA: Insufficient documentation

## 2016-07-30 DIAGNOSIS — R7302 Impaired glucose tolerance (oral): Secondary | ICD-10-CM | POA: Insufficient documentation

## 2016-07-30 DIAGNOSIS — M810 Age-related osteoporosis without current pathological fracture: Secondary | ICD-10-CM | POA: Insufficient documentation

## 2016-09-27 IMAGING — MG MM DIGITAL SCREENING BILAT W/ CAD
5 series · 5 of 5 positions shown · non-contrast
Comparison: Previous exam(s).

CLINICAL DATA: Screening.

EXAM:
DIGITAL SCREENING BILATERAL MAMMOGRAM WITH CAD

[L MLO]
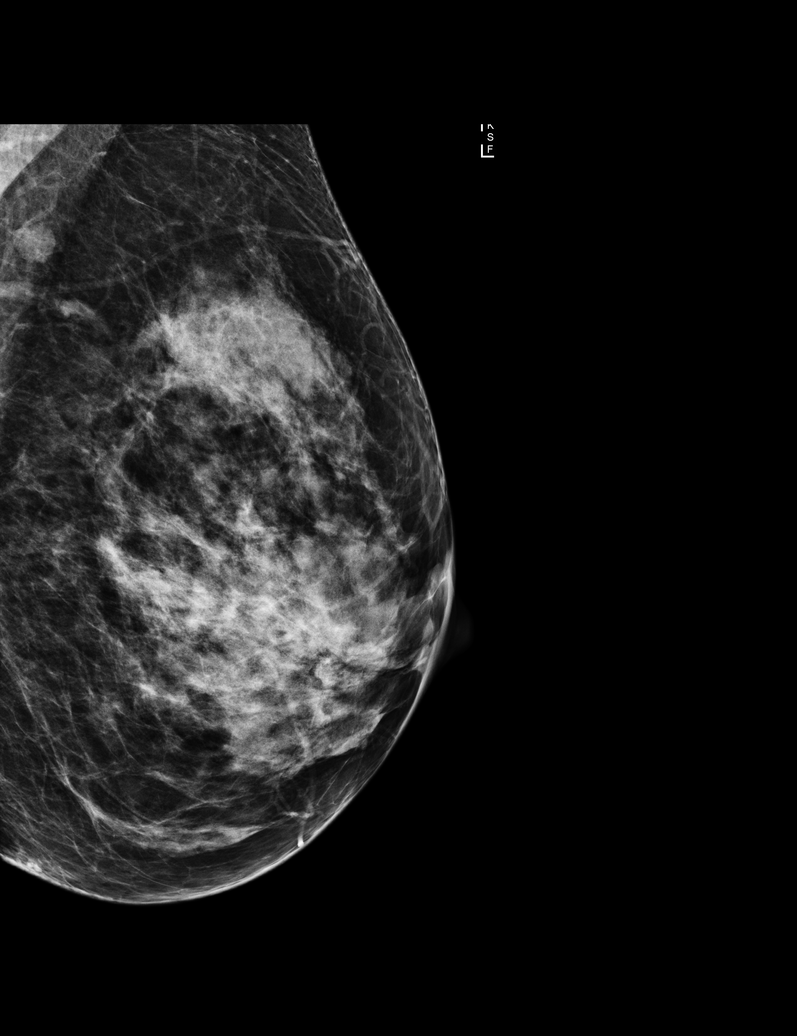

[L CC]
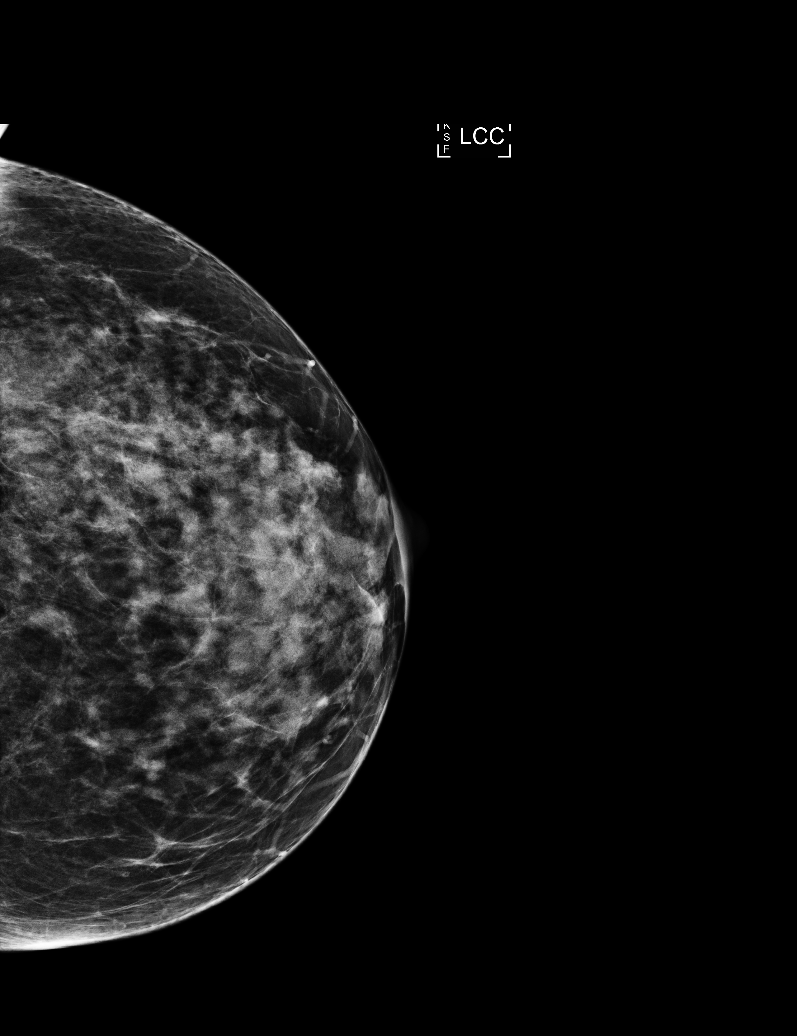

[R MLO (1 of 2)]
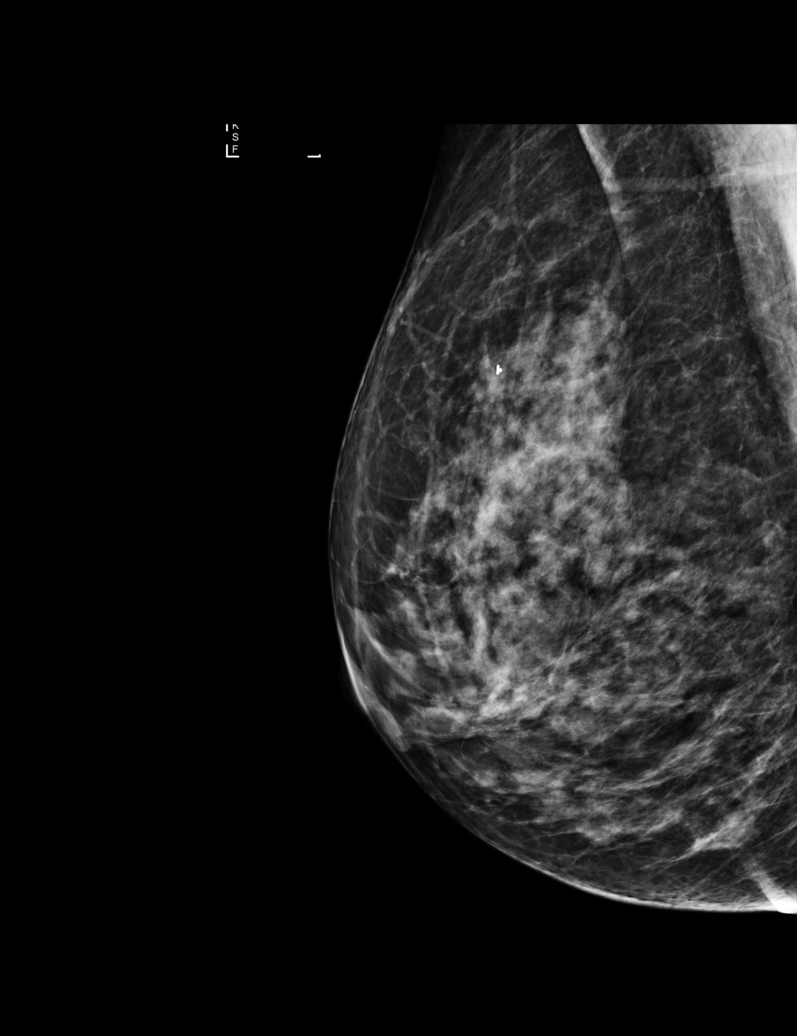

[R CC]
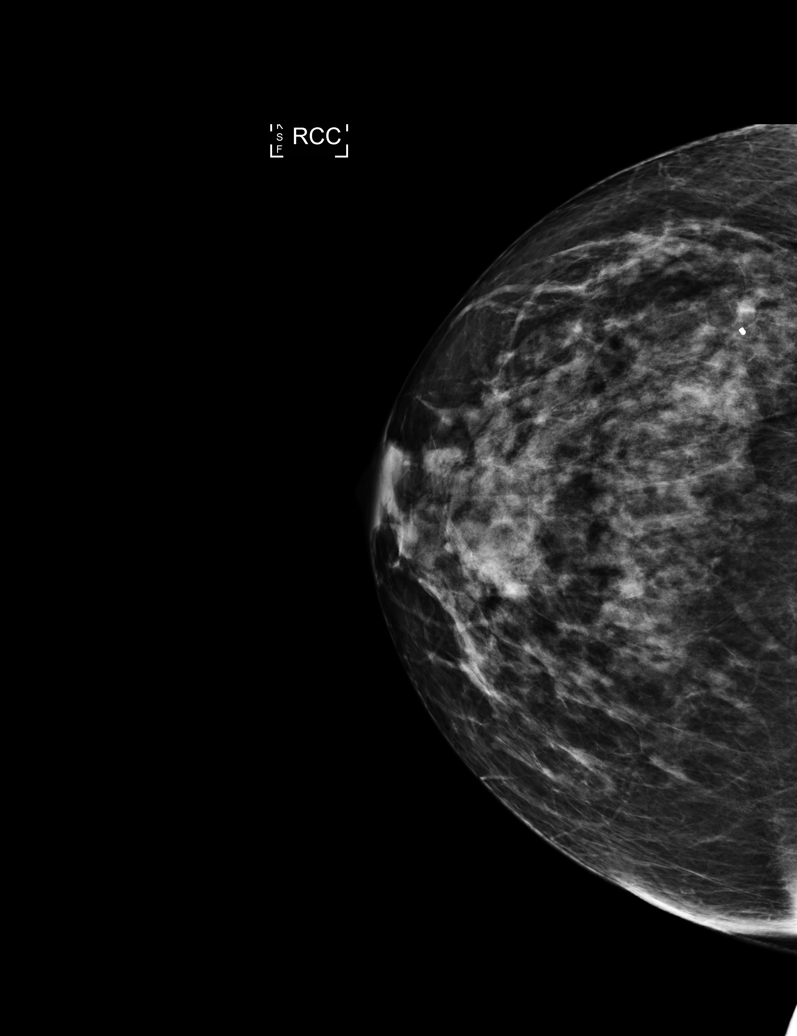

[R MLO (2 of 2)]
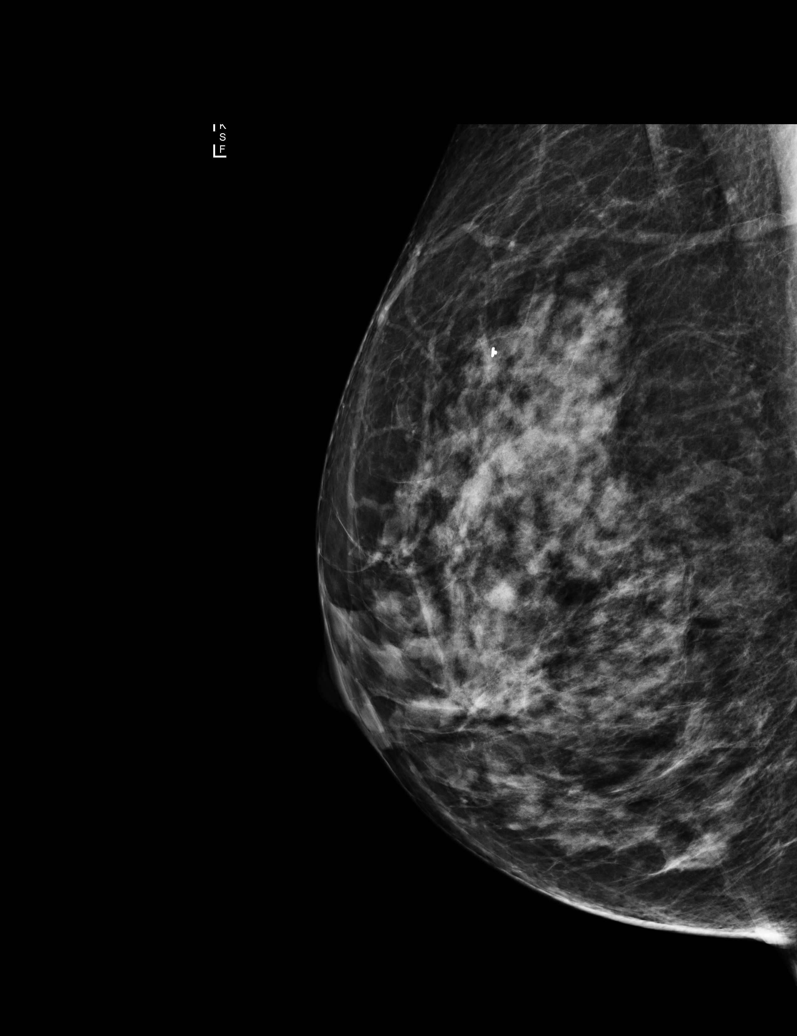

[5 of 5 positions shown; findings below may reference images not displayed]

ACR Breast Density Category c: The breast tissue is heterogeneously
dense, which may obscure small masses.
FINDINGS: There are no findings suspicious for malignancy. Images were
processed with CAD.
IMPRESSION: No mammographic evidence of malignancy. A result letter of this
screening mammogram will be mailed directly to the patient.

RECOMMENDATION:
Screening mammogram in one year. (Code:[id number])

BI-RADS CATEGORY  1: Negative.

## 2017-01-09 ENCOUNTER — Ambulatory Visit (INDEPENDENT_AMBULATORY_CARE_PROVIDER_SITE_OTHER): Payer: BC Managed Care – PPO | Admitting: Urology

## 2017-01-09 ENCOUNTER — Encounter: Payer: Self-pay | Admitting: Urology

## 2017-01-09 VITALS — BP 127/80 | HR 69 | Ht 61.0 in | Wt 118.0 lb

## 2017-01-09 DIAGNOSIS — N390 Urinary tract infection, site not specified: Secondary | ICD-10-CM

## 2017-01-09 DIAGNOSIS — K589 Irritable bowel syndrome without diarrhea: Secondary | ICD-10-CM | POA: Insufficient documentation

## 2017-01-09 DIAGNOSIS — N952 Postmenopausal atrophic vaginitis: Secondary | ICD-10-CM | POA: Diagnosis not present

## 2017-01-09 DIAGNOSIS — N895 Stricture and atresia of vagina: Secondary | ICD-10-CM | POA: Diagnosis not present

## 2017-01-09 NOTE — Progress Notes (Signed)
01/09/2017 12:35 PM   Tiffany [name] [birth year] [id number]  Referring provider: Ezequiel Kayser, MD [facility] [place], [place] [address]  Chief Complaint  Patient presents with  . Follow-up    49month    HPI: 66 year old female who presents for annual follow-up for recurrent urinary tract infections. Since her last visit 6 months ago, she's had no further urinary tract infections. She has been using vaginal estrogen cream routinely, probiotics, cranberry tablets and drinking plenty of water.  Today, she has no urinary complaints.  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  She is status post labial biopsy showed chronic inflammation but no evidence of lichen sclerosis. She has been using vaginal dilators but now with decreasing frequency.   She has very pleased and has no other concerns today.   PMH: Past Medical History:  Diagnosis Date  . Allergy   . Basal cell carcinoma   . Osteopenia   . Plantar fasciitis     Surgical History: Past Surgical History:  Procedure Laterality Date  . BREAST BIOPSY Right 03/20/14   negative  . dental implants    . RHINOPLASTY  2008   For fractured nose    Home Medications:  Allergies as of 01/09/2017      Reactions   Prednisone Other (See Comments)   Body aches with oral prednisone   Sulfa Antibiotics Nausea Only      Medication List       Accurate as of 01/09/17 11:59 PM. Always use your most recent med list.          ALPRAZolam 0.25 MG tablet Commonly known as:  XANAX Take 0.5 tablets by mouth as needed.   Biotin 1000 MCG tablet Take 1 tablet by mouth daily.   calcium citrate-vitamin D 315-200 MG-UNIT tablet Commonly known as:  CITRACAL+D Take by mouth.   CLODERM 0.1 % cream Generic drug:  Clocortolone Pivalate Apply topically as needed.   Cranberry 200 MG Caps Take by mouth.   estradiol 0.1 MG/GM vaginal cream Commonly known as:   ESTRACE VAGINAL Apply to the vulvar area twice a day until healed.   etodolac 500 MG tablet Commonly known as:  LODINE   ibandronate 150 MG tablet Commonly known as:  BONIVA   magnesium 30 MG tablet Take 250 mg by mouth 1 day or 1 dose.   multivitamin capsule Take 1 capsule by mouth daily.   phenylephrine-shark liver oil-mineral oil-petrolatum 0.25-3-14-71.9 % rectal ointment Commonly known as:  PREPARATION H Place 1 application rectally 2 (two) times daily as needed for hemorrhoids.   RA NAPROXEN SODIUM 220 MG tablet Generic drug:  naproxen sodium Take 1 tablet by mouth as needed.   vitamin C 1000 MG tablet Take 1 tablet by mouth daily.   vitamin E 400 UNIT capsule Apply 400 Units topically as needed.       Allergies:  Allergies  Allergen Reactions  . Prednisone Other (See Comments)    Body aches with oral prednisone  . Sulfa Antibiotics Nausea Only    Family History: Family History  Problem Relation Age of Onset  . Colon cancer Mother   . Hypertension Mother   . Basal cell carcinoma Mother   . Diabetes Father   . Hypertension Father   . Heart disease Father   . Alzheimer's disease Father   . Breast cancer Neg Hx     Social History:  reports that she has  never smoked. She has never used smokeless tobacco. She reports that she does not drink alcohol or use drugs.  ROS: UROLOGY Frequent Urination?: No Hard to postpone urination?: No Burning/pain with urination?: No Get up at night to urinate?: No Leakage of urine?: Yes Urine stream starts and stops?: No Trouble starting stream?: No Do you have to strain to urinate?: No Blood in urine?: No Urinary tract infection?: No Sexually transmitted disease?: No Injury to kidneys or bladder?: No Painful intercourse?: No Weak stream?: No Currently pregnant?: No Vaginal bleeding?: No Last menstrual period?: n  Gastrointestinal Nausea?: No Vomiting?: No Indigestion/heartburn?: No Diarrhea?:  No Constipation?: No  Constitutional Fever: No Night sweats?: No Weight loss?: No Fatigue?: No  Skin Skin rash/lesions?: No Itching?: No  Eyes Blurred vision?: No Double vision?: No  Ears/Nose/Throat Sore throat?: No Sinus problems?: Yes  Hematologic/Lymphatic Swollen glands?: No Easy bruising?: No  Cardiovascular Leg swelling?: No Chest pain?: No  Respiratory Cough?: No Shortness of breath?: No  Endocrine Excessive thirst?: No  Musculoskeletal Back pain?: No Joint pain?: No  Neurological Headaches?: No Dizziness?: No  Psychologic Depression?: No Anxiety?: No  Physical Exam: BP 127/80   Pulse 69   Ht 5\' 1"  (1.549 m)   Wt 118 lb (53.5 kg)   BMI 22.30 kg/m   Constitutional:  Alert and oriented, No acute distress. HEENT: [place] AT, moist mucus membranes.  Trachea midline, no masses. Cardiovascular: No clubbing, cyanosis, or edema. Respiratory: Normal respiratory effort, no increased work of breathing. GI: Abdomen is soft, nontender, nondistended, no abdominal masses.  Thin.   GU: No CVA tenderness.  Skin: No rashes, bruises or suspicious lesions. Neurologic: Grossly intact, no focal deficits, moving all 4 extremities. Psychiatric: Normal mood and affect.  Laboratory Data: No recent BMP for review  No other urinalysis or urine cultures in the past here for review  Urinalysis n/a- asymptomatic    Assessment & Plan:    1. Recurrent UTI Recommend continuation of probiotic, vaginal estrogen cream and resuming Carberry tablets twice daily I have advised her that should she develop signs or symptoms of urinary tract infection, she should call our office for same-day nurse visit appointment for a catheterized UA/urine culture, would like to start collecting urine culture data Continue double void to help optimize bladder emptying and hydration was encouraged Follow up prn  2. Atrophic vaginitis Continue vaginal estrogen cream  3. Vaginal  stenosis Managed by GYN with intermittent dilation   Return if symptoms worsen or fail to improve.  Hollice Espy, MD  [facility] [address], [place], [place] [address] [phone]

## 2017-04-22 ENCOUNTER — Encounter: Payer: Self-pay | Admitting: Obstetrics & Gynecology

## 2017-04-22 ENCOUNTER — Ambulatory Visit (INDEPENDENT_AMBULATORY_CARE_PROVIDER_SITE_OTHER): Payer: Medicare Other | Admitting: Obstetrics & Gynecology

## 2017-04-22 VITALS — BP 120/80 | HR 68 | Ht 60.0 in | Wt 119.0 lb

## 2017-04-22 DIAGNOSIS — N9089 Other specified noninflammatory disorders of vulva and perineum: Secondary | ICD-10-CM

## 2017-04-22 DIAGNOSIS — Z1231 Encounter for screening mammogram for malignant neoplasm of breast: Secondary | ICD-10-CM | POA: Diagnosis not present

## 2017-04-22 DIAGNOSIS — N952 Postmenopausal atrophic vaginitis: Secondary | ICD-10-CM | POA: Diagnosis not present

## 2017-04-22 DIAGNOSIS — Z1211 Encounter for screening for malignant neoplasm of colon: Secondary | ICD-10-CM

## 2017-04-22 DIAGNOSIS — Z1239 Encounter for other screening for malignant neoplasm of breast: Secondary | ICD-10-CM

## 2017-04-22 DIAGNOSIS — Z0000 Encounter for general adult medical examination without abnormal findings: Secondary | ICD-10-CM

## 2017-04-22 MED ORDER — FLUCONAZOLE 150 MG PO TABS: 150.0000 mg | ORAL_TABLET | Freq: Once | ORAL | 1 refills | Status: AC

## 2017-04-22 MED ORDER — IBANDRONATE SODIUM 150 MG PO TABS: 150.0000 mg | ORAL_TABLET | ORAL | 11 refills | Status: DC

## 2017-04-22 MED ORDER — ESTRADIOL 0.1 MG/GM VA CREA: TOPICAL_CREAM | VAGINAL | 2 refills | Status: DC

## 2017-04-22 MED ORDER — ALPRAZOLAM 0.25 MG PO TABS: 0.1250 mg | ORAL_TABLET | ORAL | 1 refills | Status: DC | PRN

## 2017-04-22 MED ORDER — NITROFURANTOIN MONOHYD MACRO 100 MG PO CAPS: 100.0000 mg | ORAL_CAPSULE | Freq: Two times a day (BID) | ORAL | 1 refills | Status: DC

## 2017-04-22 NOTE — Progress Notes (Signed)
HPI:      Tiffany [name] is a 66 y.o. G0P0000 who LMP was in the past, she presents today for her annual examination.  The patient has no complaints today. The patient is sexually active. Herlast pap: approximate date 2016 and was normal and last mammogram: approximate date 2017 and was normal.  The patient does perform self breast exams.  There is no notable family history of breast or ovarian cancer in her family. The patient is taking hormone replacement therapy. Patient denies post-menopausal vaginal bleeding.   The patient has regular exercise: yes. The patient denies current symptoms of depression.  Cont with ext estrace use and vag dilator therapy weekly to maintain patency. Uses Xanax rarely.  Boniva and calcium/Vit D.  GYN Hx: Last Colonoscopy:3 years ago. Normal.  Last DEXA: year ago.    PMHx: Past Medical History:  Diagnosis Date  . Allergy   . Basal cell carcinoma   . IBS (irritable bowel syndrome)   . Osteopenia   . Plantar fasciitis    Past Surgical History:  Procedure Laterality Date  . BREAST BIOPSY Right 03/20/14   negative  . dental implants    . RHINOPLASTY  2008   For fractured nose   Family History  Problem Relation Age of Onset  . Colon cancer Mother   . Hypertension Mother   . Basal cell carcinoma Mother   . Osteoporosis Mother   . Diabetes Father   . Hypertension Father   . Heart disease Father   . Alzheimer's disease Father   . Breast cancer Neg Hx    Social History  Substance Use Topics  . Smoking status: Never Smoker  . Smokeless tobacco: Never Used  . Alcohol use No    Current Outpatient Prescriptions:  .  ALPRAZolam (XANAX) 0.25 MG tablet, Take 0.5 tablets (0.125 mg total) by mouth as needed., Disp: 30 tablet, Rfl: 1 .  Ascorbic Acid (VITAMIN C) 1000 MG tablet, Take 1 tablet by mouth daily., Disp: , Rfl:  .  Biotin 1000 MCG tablet, Take 1 tablet by mouth daily., Disp: , Rfl:  .  calcium citrate-vitamin D (CITRACAL+D) 315-200 MG-UNIT  per tablet, Take by mouth., Disp: , Rfl:  .  Clocortolone Pivalate (CLODERM) 0.1 % cream, Apply topically as needed., Disp: , Rfl:  .  Cranberry 200 MG CAPS, Take by mouth., Disp: , Rfl:  .  estradiol (ESTRACE VAGINAL) 0.1 MG/GM vaginal cream, Apply to the vulvar area twice a day until healed., Disp: 42.5 g, Rfl: 2 .  etodolac (LODINE) 500 MG tablet, , Disp: , Rfl:  .  fluconazole (DIFLUCAN) 150 MG tablet, Take 1 tablet (150 mg total) by mouth once. May repeat in 7 days if symptoms persist, Disp: 2 tablet, Rfl: 1 .  ibandronate (BONIVA) 150 MG tablet, Take 1 tablet (150 mg total) by mouth every 30 (thirty) days., Disp: 1 tablet, Rfl: 11 .  magnesium 30 MG tablet, Take 250 mg by mouth 1 day or 1 dose., Disp: , Rfl:  .  Multiple Vitamin (MULTIVITAMIN) capsule, Take 1 capsule by mouth daily., Disp: , Rfl:  .  naproxen sodium (RA NAPROXEN SODIUM) 220 MG tablet, Take 1 tablet by mouth as needed., Disp: , Rfl:  .  nitrofurantoin, macrocrystal-monohydrate, (MACROBID) 100 MG capsule, Take 1 capsule (100 mg total) by mouth 2 (two) times daily., Disp: 10 capsule, Rfl: 1 .  phenylephrine-shark liver oil-mineral oil-petrolatum (PREPARATION H) 0.25-3-14-71.9 % rectal ointment, Place 1 application rectally 2 (two) times  daily as needed for hemorrhoids., Disp: , Rfl:  .  vitamin E 400 UNIT capsule, Apply 400 Units topically as needed., Disp: , Rfl:  Allergies: Prednisone and Sulfa antibiotics  Review of Systems  Constitutional: Negative for chills, fever and malaise/fatigue.  HENT: Negative for congestion, sinus pain and sore throat.   Eyes: Negative for blurred vision and pain.  Respiratory: Negative for cough and wheezing.   Cardiovascular: Negative for chest pain and leg swelling.  Gastrointestinal: Negative for abdominal pain, constipation, diarrhea, heartburn, nausea and vomiting.  Genitourinary: Negative for dysuria, frequency, hematuria and urgency.  Musculoskeletal: Negative for back pain, joint  pain, myalgias and neck pain.  Skin: Negative for itching and rash.  Neurological: Negative for dizziness, tremors and weakness.  Endo/Heme/Allergies: Does not bruise/bleed easily.  Psychiatric/Behavioral: Negative for depression. The patient is not nervous/anxious and does not have insomnia.     Objective: BP 120/80   Pulse 68   Ht 5' (1.524 m)   Wt 119 lb (54 kg)   BMI 23.24 kg/m   Filed Weights   04/22/17 1010  Weight: 119 lb (54 kg)   Body mass index is 23.24 kg/m. Physical Exam  Constitutional: She is oriented to person, place, and time. She appears well-developed and well-nourished. No distress.  Genitourinary: Rectum normal, vagina normal and uterus normal. Pelvic exam was performed with patient supine. There is no rash or lesion on the right labia. There is no rash or lesion on the left labia. Vagina exhibits no lesion. No bleeding in the vagina. Right adnexum does not display mass and does not display tenderness. Left adnexum does not display mass and does not display tenderness. Cervix does not exhibit motion tenderness, lesion, friability or polyp.   Uterus is mobile and midaxial. Uterus is not enlarged or exhibiting a mass.  Genitourinary Comments: Mod atrophy and vag narrowing noted  HENT:  Head: Normocephalic and atraumatic. Head is without laceration.  Right Ear: Hearing normal.  Left Ear: Hearing normal.  Nose: No epistaxis.  No foreign bodies.  Mouth/Throat: Uvula is midline, oropharynx is clear and moist and mucous membranes are normal.  Eyes: Pupils are equal, round, and reactive to light.  Neck: Normal range of motion. Neck supple. No thyromegaly present.  Cardiovascular: Normal rate and regular rhythm.  Exam reveals no gallop and no friction rub.   No murmur heard. Pulmonary/Chest: Effort normal and breath sounds normal. No respiratory distress. She has no wheezes. Right breast exhibits no mass, no skin change and no tenderness. Left breast exhibits no mass, no  skin change and no tenderness.  Abdominal: Soft. Bowel sounds are normal. She exhibits no distension. There is no tenderness. There is no rebound.  Musculoskeletal: Normal range of motion.  Neurological: She is alert and oriented to person, place, and time. No cranial nerve deficit.  Skin: Skin is warm and dry.  Psychiatric: She has a normal mood and affect. Judgment normal.  Vitals reviewed.   Assessment: Annual Exam 1. Annual physical exam   2. Screening for breast cancer   3. Screen for colon cancer   4. Vaginal atrophy   5. Vulvar lesion     Plan:            1.  Cervical Screening-  Pap smear schedule reviewed with patient  2. Breast screening- Exam annually and mammogram scheduled  3. Colonoscopy every 10 years, Hemoccult testing after age 70  4. Labs managed by PCP  5. Counseling for hormonal therapy: no change in  therapy today     F/U  Return in about 1 year (around 04/22/2018) for Annual.  Barnett Applebaum, MD, [facility], [facility] 04/22/2017  2:05 PM

## 2017-04-22 NOTE — Patient Instructions (Signed)
Mammogram every year    Call [phone] to schedule at [facility] Colonoscopy every 10 years Labs yearly (with PCP)

## 2017-05-25 ENCOUNTER — Ambulatory Visit
Admission: RE | Admit: 2017-05-25 | Discharge: 2017-05-25 | Disposition: A | Discharge status: Home / Self Care | Payer: Medicare Other | Source: Ambulatory Visit | Attending: Obstetrics & Gynecology | Admitting: Obstetrics & Gynecology

## 2017-05-25 DIAGNOSIS — Z1231 Encounter for screening mammogram for malignant neoplasm of breast: Secondary | ICD-10-CM | POA: Diagnosis present

## 2017-05-25 DIAGNOSIS — Z1239 Encounter for other screening for malignant neoplasm of breast: Secondary | ICD-10-CM

## 2017-06-23 ENCOUNTER — Other Ambulatory Visit: Payer: Self-pay | Admitting: Obstetrics & Gynecology

## 2017-06-23 DIAGNOSIS — N9089 Other specified noninflammatory disorders of vulva and perineum: Secondary | ICD-10-CM

## 2018-04-14 ENCOUNTER — Other Ambulatory Visit: Payer: Self-pay | Admitting: Obstetrics & Gynecology

## 2018-04-27 ENCOUNTER — Encounter: Payer: Self-pay | Admitting: Obstetrics & Gynecology

## 2018-04-27 ENCOUNTER — Ambulatory Visit (INDEPENDENT_AMBULATORY_CARE_PROVIDER_SITE_OTHER): Payer: Medicare Other | Admitting: Obstetrics & Gynecology

## 2018-04-27 ENCOUNTER — Other Ambulatory Visit (HOSPITAL_COMMUNITY)
Admission: RE | Admit: 2018-04-27 | Discharge: 2018-04-27 | Disposition: A | Discharge status: Home / Self Care | Payer: Medicare Other | Source: Ambulatory Visit | Attending: Obstetrics & Gynecology | Admitting: Obstetrics & Gynecology

## 2018-04-27 VITALS — BP 140/80 | Ht 60.0 in | Wt 120.0 lb

## 2018-04-27 DIAGNOSIS — Z1231 Encounter for screening mammogram for malignant neoplasm of breast: Secondary | ICD-10-CM

## 2018-04-27 DIAGNOSIS — N952 Postmenopausal atrophic vaginitis: Secondary | ICD-10-CM

## 2018-04-27 DIAGNOSIS — Z01411 Encounter for gynecological examination (general) (routine) with abnormal findings: Secondary | ICD-10-CM

## 2018-04-27 DIAGNOSIS — Z124 Encounter for screening for malignant neoplasm of cervix: Secondary | ICD-10-CM | POA: Diagnosis present

## 2018-04-27 DIAGNOSIS — F419 Anxiety disorder, unspecified: Secondary | ICD-10-CM

## 2018-04-27 DIAGNOSIS — Z1211 Encounter for screening for malignant neoplasm of colon: Secondary | ICD-10-CM

## 2018-04-27 DIAGNOSIS — Z1151 Encounter for screening for human papillomavirus (HPV): Secondary | ICD-10-CM | POA: Insufficient documentation

## 2018-04-27 DIAGNOSIS — Z1239 Encounter for other screening for malignant neoplasm of breast: Secondary | ICD-10-CM

## 2018-04-27 DIAGNOSIS — Z0000 Encounter for general adult medical examination without abnormal findings: Secondary | ICD-10-CM

## 2018-04-27 DIAGNOSIS — N9089 Other specified noninflammatory disorders of vulva and perineum: Secondary | ICD-10-CM

## 2018-04-27 MED ORDER — ALPRAZOLAM 0.25 MG PO TABS: 0.1250 mg | ORAL_TABLET | ORAL | 1 refills | Status: DC | PRN

## 2018-04-27 MED ORDER — ESTRACE 0.1 MG/GM VA CREA: TOPICAL_CREAM | VAGINAL | 2 refills | Status: DC

## 2018-04-27 NOTE — Patient Instructions (Signed)
PAP every three years Mammogram every year    Call [phone] to schedule at [facility] late August Colonoscopy every 5 years Labs yearly (with PCP)

## 2018-04-27 NOTE — Progress Notes (Signed)
HPI:      [name] is a 67 y.o. G0P0000 who LMP was in the past, she presents today for her annual examination.  The patient reports doing well w vag estrogen.  Has skin lesion on chest of concern. The patient is not sexually active. Herlast pap: approximate date 2016 and was normal and last mammogram: approximate date 2018 and was normal.  The patient does perform self breast exams.  There is no notable family history of breast or ovarian cancer in her family. The patient is taking hormone replacement therapy. Patient denies post-menopausal vaginal bleeding.   The patient has regular exercise: yes. The patient denies current symptoms of depression.    GYN Hx: Last Colonoscopy:4 years ago. Normal.  Last DEXA: year ago.    PMHx: Past Medical History:  Diagnosis Date  . Allergy   . Basal cell carcinoma   . IBS (irritable bowel syndrome)   . Osteopenia   . Plantar fasciitis    Past Surgical History:  Procedure Laterality Date  . BREAST BIOPSY Right 03/20/14   negative  . dental implants    . RHINOPLASTY  2008   For fractured nose   Family History  Problem Relation Age of Onset  . Colon cancer Mother   . Hypertension Mother   . Basal cell carcinoma Mother   . Osteoporosis Mother   . Diabetes Father   . Hypertension Father   . Heart disease Father   . Alzheimer's disease Father   . Breast cancer Neg Hx    Social History   Tobacco Use  . Smoking status: Never Smoker  . Smokeless tobacco: Never Used  Substance Use Topics  . Alcohol use: No  . Drug use: No    Current Outpatient Medications:  .  ALPRAZolam (XANAX) 0.25 MG tablet, Take 0.5 tablets (0.125 mg total) by mouth as needed., Disp: 30 tablet, Rfl: 1 .  Ascorbic Acid (VITAMIN C) 1000 MG tablet, Take 1 tablet by mouth daily., Disp: , Rfl:  .  azelastine (ASTELIN) 0.1 % nasal spray, Place into the nose., Disp: , Rfl:  .  Biotin 1000 MCG tablet, Take 1 tablet by mouth daily., Disp: , Rfl:  .  calcium  citrate-vitamin D (CITRACAL+D) 315-200 MG-UNIT per tablet, Take by mouth., Disp: , Rfl:  .  Clocortolone Pivalate (CLODERM) 0.1 % cream, Apply topically as needed., Disp: , Rfl:  .  Cranberry 200 MG CAPS, Take by mouth., Disp: , Rfl:  .  ESTRACE VAGINAL 0.1 MG/GM vaginal cream, Apply small amount 3 times weekly, Disp: 42.5 g, Rfl: 2 .  etodolac (LODINE) 500 MG tablet, , Disp: , Rfl:  .  ibandronate (BONIVA) 150 MG tablet, Take 1 tablet (150 mg total) by mouth every 30 (thirty) days., Disp: 1 tablet, Rfl: 11 .  magnesium 30 MG tablet, Take 250 mg by mouth 1 day or 1 dose., Disp: , Rfl:  .  Multiple Vitamin (MULTIVITAMIN) capsule, Take 1 capsule by mouth daily., Disp: , Rfl:  .  naproxen sodium (RA NAPROXEN SODIUM) 220 MG tablet, Take 1 tablet by mouth as needed., Disp: , Rfl:  .  phenylephrine-shark liver oil-mineral oil-petrolatum (PREPARATION H) 0.25-3-14-71.9 % rectal ointment, Place 1 application rectally 2 (two) times daily as needed for hemorrhoids., Disp: , Rfl:  .  vitamin E 400 UNIT capsule, Apply 400 Units topically as needed., Disp: , Rfl:  .  nitrofurantoin, macrocrystal-monohydrate, (MACROBID) 100 MG capsule, Take 1 capsule (100 mg total) by mouth 2 (  two) times daily. (Patient not taking: Reported on 04/27/2018), Disp: 10 capsule, Rfl: 1 Allergies: Prednisone and Sulfa antibiotics  Review of Systems  Constitutional: Negative for chills, fever and malaise/fatigue.  HENT: Negative for congestion, sinus pain and sore throat.   Eyes: Negative for blurred vision and pain.  Respiratory: Negative for cough and wheezing.   Cardiovascular: Negative for chest pain and leg swelling.  Gastrointestinal: Negative for abdominal pain, constipation, diarrhea, heartburn, nausea and vomiting.  Genitourinary: Negative for dysuria, frequency, hematuria and urgency.  Musculoskeletal: Negative for back pain, joint pain, myalgias and neck pain.  Skin: Negative for itching and rash.  Neurological: Negative  for dizziness, tremors and weakness.  Endo/Heme/Allergies: Does not bruise/bleed easily.  Psychiatric/Behavioral: Negative for depression. The patient is not nervous/anxious and does not have insomnia.     Objective: BP 140/80   Ht 5' (1.524 m)   Wt 120 lb (54.4 kg)   BMI 23.44 kg/m   Filed Weights   04/27/18 1344  Weight: 120 lb (54.4 kg)   Body mass index is 23.44 kg/m. Physical Exam  Constitutional: She is oriented to person, place, and time. She appears well-developed and well-nourished. No distress.  Genitourinary: Rectum normal, vagina normal and uterus normal. Pelvic exam was performed with patient supine. There is no rash or lesion on the right labia. There is no rash or lesion on the left labia. Vagina exhibits no lesion. No bleeding in the vagina. Right adnexum does not display mass and does not display tenderness. Left adnexum does not display mass and does not display tenderness. Cervix does not exhibit motion tenderness, lesion, friability or polyp.   Uterus is mobile and midaxial. Uterus is not enlarged or exhibiting a mass.  Genitourinary Comments: Vag/vulvar atrophy  HENT:  Head: Normocephalic and atraumatic. Head is without laceration.  Right Ear: Hearing normal.  Left Ear: Hearing normal.  Nose: No epistaxis.  No foreign bodies.  Mouth/Throat: Uvula is midline, oropharynx is clear and moist and mucous membranes are normal.  Eyes: Pupils are equal, round, and reactive to light.  Neck: Normal range of motion. Neck supple. No thyromegaly present.  Cardiovascular: Normal rate and regular rhythm. Exam reveals no gallop and no friction rub.  No murmur heard. Pulmonary/Chest: Effort normal and breath sounds normal. No respiratory distress. She has no wheezes. Right breast exhibits no mass, no skin change and no tenderness. Left breast exhibits no mass, no skin change and no tenderness.  Abdominal: Soft. Bowel sounds are normal. She exhibits no distension. There is no  tenderness. There is no rebound.  Musculoskeletal: Normal range of motion.  Neurological: She is alert and oriented to person, place, and time. No cranial nerve deficit.  Skin: Skin is warm and dry.  Chest wall skin lesion c/w squamous cell lesion  Psychiatric: She has a normal mood and affect. Judgment normal.  Vitals reviewed.  Assessment: Annual Exam 1. Annual physical exam   2. Vulvar lesion   3. Screen for colon cancer   4. Screening for cervical cancer   5. Screening for breast cancer   6. Anxiety   7. Vaginal atrophy    Plan:            1.  Cervical Screening-  Pap smear done today  2. Breast screening- Exam annually and mammogram scheduled, h/o dense breast tissue and she requests 3D MMG  3. Colonoscopy every 5 years due to polyp found 4 years ago, Hemoccult testing yearly (today)  4. Labs managed by PCP  5. Counseling for hormonal therapy: no change in therapy today.  Takes a small amount of Estrogen cream externally 3 times per week  6. Anxiety, Xanax prn  7. Osteopenia, sees endocrine.  Boniva monthly. Due for DEXA this year, per Dr Gabriel Carina.  8. Chest wall skin lesion, may be SCC or BCC, to see Dermatology soon for evaluation    F/U  Return in about 1 year (around 04/28/2019) for Annual.  Barnett Applebaum, MD, [facility], [facility] 04/27/2018  2:26 PM

## 2018-04-28 LAB — CYTOLOGY - PAP
Diagnosis: NEGATIVE
HPV: NOT DETECTED

## 2018-05-26 ENCOUNTER — Ambulatory Visit
Admission: RE | Admit: 2018-05-26 | Discharge: 2018-05-26 | Disposition: A | Discharge status: Home / Self Care | Payer: Medicare Other | Source: Ambulatory Visit | Attending: Obstetrics & Gynecology | Admitting: Obstetrics & Gynecology

## 2018-05-26 DIAGNOSIS — Z1231 Encounter for screening mammogram for malignant neoplasm of breast: Secondary | ICD-10-CM | POA: Insufficient documentation

## 2018-05-26 DIAGNOSIS — Z1239 Encounter for other screening for malignant neoplasm of breast: Secondary | ICD-10-CM

## 2018-06-29 ENCOUNTER — Other Ambulatory Visit: Payer: Self-pay | Admitting: Internal Medicine

## 2018-06-29 DIAGNOSIS — M810 Age-related osteoporosis without current pathological fracture: Secondary | ICD-10-CM

## 2018-08-04 ENCOUNTER — Ambulatory Visit
Admission: RE | Admit: 2018-08-04 | Discharge: 2018-08-04 | Disposition: A | Discharge status: Home / Self Care | Payer: Medicare Other | Source: Ambulatory Visit | Attending: Internal Medicine | Admitting: Internal Medicine

## 2018-08-04 DIAGNOSIS — M810 Age-related osteoporosis without current pathological fracture: Secondary | ICD-10-CM | POA: Diagnosis present

## 2018-08-05 DIAGNOSIS — M5136 Other intervertebral disc degeneration, lumbar region: Secondary | ICD-10-CM

## 2018-08-05 DIAGNOSIS — M51369 Other intervertebral disc degeneration, lumbar region without mention of lumbar back pain or lower extremity pain: Secondary | ICD-10-CM

## 2018-08-05 HISTORY — DX: Other intervertebral disc degeneration, lumbar region without mention of lumbar back pain or lower extremity pain: M51.369

## 2018-08-05 HISTORY — DX: Other intervertebral disc degeneration, lumbar region: M51.36

## 2019-04-29 ENCOUNTER — Encounter: Payer: Self-pay | Admitting: Obstetrics & Gynecology

## 2019-04-29 ENCOUNTER — Other Ambulatory Visit: Payer: Self-pay

## 2019-04-29 ENCOUNTER — Ambulatory Visit (INDEPENDENT_AMBULATORY_CARE_PROVIDER_SITE_OTHER): Payer: Medicare Other | Admitting: Obstetrics & Gynecology

## 2019-04-29 VITALS — BP 140/82 | HR 84 | Ht 61.0 in | Wt 122.0 lb

## 2019-04-29 DIAGNOSIS — Z1239 Encounter for other screening for malignant neoplasm of breast: Secondary | ICD-10-CM

## 2019-04-29 DIAGNOSIS — N9089 Other specified noninflammatory disorders of vulva and perineum: Secondary | ICD-10-CM

## 2019-04-29 DIAGNOSIS — Z01419 Encounter for gynecological examination (general) (routine) without abnormal findings: Secondary | ICD-10-CM

## 2019-04-29 DIAGNOSIS — N952 Postmenopausal atrophic vaginitis: Secondary | ICD-10-CM

## 2019-04-29 MED ORDER — ALPRAZOLAM 0.25 MG PO TABS: 0.1250 mg | ORAL_TABLET | ORAL | 1 refills | Status: DC | PRN

## 2019-04-29 MED ORDER — ESTRACE 0.1 MG/GM VA CREA: TOPICAL_CREAM | VAGINAL | 2 refills | Status: DC

## 2019-04-29 NOTE — Progress Notes (Signed)
HPI:      [name] is a 68 y.o. G0P0000 who LMP was in the past, she presents today for her annual examination.  The patient has no complaints today. The patient is not currently sexually active. Herlast pap: approximate date 2019 and was normal and last mammogram: approximate date 2019 and was normal.  The patient does perform self breast exams.  There is notable family history of breast or ovarian cancer in her family. The patient is taking hormone replacement therapy. Patient denies post-menopausal vaginal bleeding.   The patient has regular exercise: yes. The patient denies current symptoms of depression.    GYN Hx: Last Colonoscopy:5 years ago. Normal.  Last DEXA: never ago.    PMHx: Past Medical History:  Diagnosis Date  . Allergy   . Basal cell carcinoma   . Basal cell carcinoma (BCC) of right upper arm 04/27/2008  . History of dysplastic nevus   . IBS (irritable bowel syndrome)   . Osteopenia   . Plantar fasciitis    Past Surgical History:  Procedure Laterality Date  . BREAST BIOPSY Right 03/20/14   negative  . dental implants    . RHINOPLASTY  2008   For fractured nose   Family History  Problem Relation Age of Onset  . Colon cancer Mother   . Hypertension Mother   . Basal cell carcinoma Mother   . Osteoporosis Mother   . Diabetes Father   . Hypertension Father   . Heart disease Father   . Alzheimer's disease Father   . Breast cancer Neg Hx    Social History   Tobacco Use  . Smoking status: Never Smoker  . Smokeless tobacco: Never Used  Substance Use Topics  . Alcohol use: No  . Drug use: No    Current Outpatient Medications:  .  fexofenadine (ALLEGRA ALLERGY) 180 MG tablet, , Disp: , Rfl:  .  ALPRAZolam (XANAX) 0.25 MG tablet, Take 0.5 tablets (0.125 mg total) by mouth as needed., Disp: 30 tablet, Rfl: 1 .  Ascorbic Acid (VITAMIN C) 1000 MG tablet, Take 1 tablet by mouth daily., Disp: , Rfl:  .  azelastine (ASTELIN) 0.1 % nasal spray, Place  into the nose., Disp: , Rfl:  .  Biotin 1000 MCG tablet, Take 1 tablet by mouth daily., Disp: , Rfl:  .  calcium citrate-vitamin D (CITRACAL+D) 315-200 MG-UNIT per tablet, Take by mouth., Disp: , Rfl:  .  Clocortolone Pivalate (CLODERM) 0.1 % cream, Apply topically as needed., Disp: , Rfl:  .  Cranberry 200 MG CAPS, Take by mouth., Disp: , Rfl:  .  ESTRACE VAGINAL 0.1 MG/GM vaginal cream, Apply small amount 3 times weekly, Disp: 42.5 g, Rfl: 2 .  etodolac (LODINE) 500 MG tablet, , Disp: , Rfl:  .  ibandronate (BONIVA) 150 MG tablet, Take 1 tablet (150 mg total) by mouth every 30 (thirty) days., Disp: 1 tablet, Rfl: 11 .  magnesium 30 MG tablet, Take 250 mg by mouth 1 day or 1 dose., Disp: , Rfl:  .  mometasone (ELOCON) 0.1 % cream, , Disp: , Rfl:  .  Multiple Vitamin (MULTIVITAMIN) capsule, Take 1 capsule by mouth daily., Disp: , Rfl:  .  naproxen sodium (RA NAPROXEN SODIUM) 220 MG tablet, Take 1 tablet by mouth as needed., Disp: , Rfl:  .  phenylephrine-shark liver oil-mineral oil-petrolatum (PREPARATION H) 0.25-3-14-71.9 % rectal ointment, Place 1 application rectally 2 (two) times daily as needed for hemorrhoids., Disp: , Rfl:  .  vitamin E 400 UNIT capsule, Apply 400 Units topically as needed., Disp: , Rfl:  Allergies: Prednisone and Sulfa antibiotics  Review of Systems  Constitutional: Negative for chills, fever and malaise/fatigue.  HENT: Negative for congestion, sinus pain and sore throat.   Eyes: Negative for blurred vision and pain.  Respiratory: Negative for cough and wheezing.   Cardiovascular: Negative for chest pain and leg swelling.  Gastrointestinal: Negative for abdominal pain, constipation, diarrhea, heartburn, nausea and vomiting.  Genitourinary: Negative for dysuria, frequency, hematuria and urgency.  Musculoskeletal: Negative for back pain, joint pain, myalgias and neck pain.  Skin: Negative for itching and rash.  Neurological: Negative for dizziness, tremors and  weakness.  Endo/Heme/Allergies: Does not bruise/bleed easily.  Psychiatric/Behavioral: Negative for depression. The patient is not nervous/anxious and does not have insomnia.     Objective: BP 140/82   Pulse 84   Ht 5\' 1"  (1.549 m)   Wt 122 lb (55.3 kg)   BMI 23.05 kg/m   Filed Weights   04/29/19 1429  Weight: 122 lb (55.3 kg)   Body mass index is 23.05 kg/m. Physical Exam Constitutional:      General: She is not in acute distress.    Appearance: She is well-developed.  Genitourinary:     Pelvic exam was performed with patient supine.     Vagina, uterus and rectum normal.     No lesions in the vagina.     No vaginal bleeding.     No cervical motion tenderness, friability, lesion or polyp.     Uterus is mobile.     Uterus is not enlarged.     No uterine mass detected.    Uterus is midaxial.     No right or left adnexal mass present.     Right adnexa not tender.     Left adnexa not tender.     Genitourinary Comments: Narrow vaginal, tender exam Mild atrophy  HENT:     Head: Normocephalic and atraumatic. No laceration.     Right Ear: Hearing normal.     Left Ear: Hearing normal.     Mouth/Throat:     Pharynx: Uvula midline.  Eyes:     Pupils: Pupils are equal, round, and reactive to light.  Neck:     Musculoskeletal: Normal range of motion and neck supple.     Thyroid: No thyromegaly.  Cardiovascular:     Rate and Rhythm: Normal rate and regular rhythm.     Heart sounds: No murmur. No friction rub. No gallop.   Pulmonary:     Effort: Pulmonary effort is normal. No respiratory distress.     Breath sounds: Normal breath sounds. No wheezing.  Chest:     Breasts:        Right: No mass, skin change or tenderness.        Left: No mass, skin change or tenderness.  Abdominal:     General: Bowel sounds are normal. There is no distension.     Palpations: Abdomen is soft.     Tenderness: There is no abdominal tenderness. There is no rebound.  Musculoskeletal: Normal  range of motion.  Neurological:     Mental Status: She is alert and oriented to person, place, and time.     Cranial Nerves: No cranial nerve deficit.  Skin:    General: Skin is warm and dry.  Psychiatric:        Judgment: Judgment normal.  Vitals signs reviewed.     Assessment: Annual Exam  1. Women's annual routine gynecological examination   2. Screening for breast cancer        Plan:            1.  Cervical Screening-  Pap smear schedule reviewed with patient  2. Breast screening- Exam annually and mammogram scheduled  3. Colonoscopy every 5 years due to prior polyps (due soon), Hemoccult testing after age 57  4. Labs managed by PCP  5. Counseling for hormonal therapy: no change in therapy today Vag ERT 3 x weekly              6. FRAX - FRAX score for assessing the 10 year probability for fracture calculated and discussed today.  Based on age and score today, DEXA is not scheduled.    F/U  Return in about 1 year (around 04/28/2020) for Annual.  Barnett Applebaum, MD, [facility], [facility] 04/29/2019  2:49 PM

## 2019-04-29 NOTE — Patient Instructions (Signed)
PAP every three years - due 2022 Mammogram every year - due 2020 (late Aug or Sept)    Call [phone] to schedule at [facility] Colonoscopy every 5 years - due 2020 Labs yearly (with PCP)

## 2019-05-31 ENCOUNTER — Other Ambulatory Visit: Payer: Self-pay

## 2019-05-31 ENCOUNTER — Ambulatory Visit
Admission: RE | Admit: 2019-05-31 | Discharge: 2019-05-31 | Disposition: A | Discharge status: Home / Self Care | Payer: Medicare Other | Source: Ambulatory Visit | Attending: Obstetrics & Gynecology | Admitting: Obstetrics & Gynecology

## 2019-05-31 DIAGNOSIS — Z1239 Encounter for other screening for malignant neoplasm of breast: Secondary | ICD-10-CM

## 2019-05-31 DIAGNOSIS — Z1231 Encounter for screening mammogram for malignant neoplasm of breast: Secondary | ICD-10-CM | POA: Insufficient documentation

## 2019-07-21 DIAGNOSIS — Z86018 Personal history of other benign neoplasm: Secondary | ICD-10-CM

## 2019-07-21 HISTORY — DX: Personal history of other benign neoplasm: Z86.018

## 2019-08-05 DIAGNOSIS — E7800 Pure hypercholesterolemia, unspecified: Secondary | ICD-10-CM

## 2019-08-05 HISTORY — DX: Pure hypercholesterolemia, unspecified: E78.00

## 2020-05-01 ENCOUNTER — Other Ambulatory Visit: Payer: Self-pay

## 2020-05-01 ENCOUNTER — Other Ambulatory Visit: Payer: Self-pay | Admitting: Obstetrics & Gynecology

## 2020-05-01 ENCOUNTER — Telehealth: Payer: Self-pay

## 2020-05-01 ENCOUNTER — Ambulatory Visit (INDEPENDENT_AMBULATORY_CARE_PROVIDER_SITE_OTHER): Payer: Medicare PPO | Admitting: Obstetrics & Gynecology

## 2020-05-01 ENCOUNTER — Encounter: Payer: Self-pay | Admitting: Obstetrics & Gynecology

## 2020-05-01 VITALS — BP 130/80 | Ht 61.0 in | Wt 120.0 lb

## 2020-05-01 DIAGNOSIS — F419 Anxiety disorder, unspecified: Secondary | ICD-10-CM

## 2020-05-01 DIAGNOSIS — N9089 Other specified noninflammatory disorders of vulva and perineum: Secondary | ICD-10-CM | POA: Diagnosis not present

## 2020-05-01 DIAGNOSIS — Z1231 Encounter for screening mammogram for malignant neoplasm of breast: Secondary | ICD-10-CM

## 2020-05-01 DIAGNOSIS — Z01419 Encounter for gynecological examination (general) (routine) without abnormal findings: Secondary | ICD-10-CM

## 2020-05-01 MED ORDER — ESTRACE 0.1 MG/GM VA CREA: TOPICAL_CREAM | VAGINAL | 2 refills | Status: DC

## 2020-05-01 MED ORDER — ALPRAZOLAM 0.25 MG PO TABS: 0.1250 mg | ORAL_TABLET | ORAL | 1 refills | Status: DC | PRN

## 2020-05-01 MED ORDER — ALPRAZOLAM 0.25 MG PO TABS: 0.2500 mg | ORAL_TABLET | ORAL | 2 refills | Status: DC | PRN

## 2020-05-01 NOTE — Telephone Encounter (Signed)
Pt states she only got 15 xanax at the pharmacy, She states since the Rx says she is to take 1/2 a day.. they would not give her the full RX. She states she usually has 2 refills, could you switch the Rx to 1 a day so she can get the full rx

## 2020-05-01 NOTE — Patient Instructions (Signed)
PAP every three years Mammogram every year    Call [phone] to schedule at [facility] Colonoscopy every 5 years Labs yearly (with PCP)  Thank you for choosing [facility]. As part of our ongoing efforts to improve patient experience, we would appreciate your feedback. Please fill out the short survey that you will receive by mail or MyChart. Your opinion is important to us! - Dr. Jamarie Mussa   

## 2020-05-01 NOTE — Progress Notes (Signed)
HPI:      [name] is a 69 y.o. G0P0000 who LMP was in the past, she presents today for her annual examination.  The patient has no complaints today. The patient is not sexually active. Herlast pap: approximate date 2019 and was normal and last mammogram: approximate date 2020 and was normal.  The patient does perform self breast exams.  There is no notable family history of breast or ovarian cancer in her family. The patient is taking hormone replacement therapy- Vulvar ERT only. Patient denies post-menopausal vaginal bleeding.   The patient has regular exercise: yes. The patient denies current symptoms of depression.    GYN Hx: Last Colonoscopy:5 years ago. Normal.  Last DEXA: never ago.    PMHx: Past Medical History:  Diagnosis Date  . Allergy   . Basal cell carcinoma   . Basal cell carcinoma (BCC) of right upper arm 04/27/2008  . History of dysplastic nevus   . IBS (irritable bowel syndrome)   . Osteopenia   . Plantar fasciitis    Past Surgical History:  Procedure Laterality Date  . BREAST BIOPSY Right 03/20/14   negative  . dental implants    . RHINOPLASTY  2008   For fractured nose   Family History  Problem Relation Age of Onset  . Colon cancer Mother   . Hypertension Mother   . Basal cell carcinoma Mother   . Osteoporosis Mother   . Diabetes Father   . Hypertension Father   . Heart disease Father   . Alzheimer's disease Father   . Breast cancer Neg Hx    Social History   Tobacco Use  . Smoking status: Never Smoker  . Smokeless tobacco: Never Used  Vaping Use  . Vaping Use: Never used  Substance Use Topics  . Alcohol use: No  . Drug use: No    Current Outpatient Medications:  .  ALPRAZolam (XANAX) 0.25 MG tablet, Take 0.5 tablets (0.125 mg total) by mouth as needed., Disp: 30 tablet, Rfl: 1 .  azelastine (ASTELIN) 0.1 % nasal spray, Place into the nose., Disp: , Rfl:  .  Biotin 1000 MCG tablet, Take 1 tablet by mouth daily., Disp: , Rfl:  .   calcium citrate-vitamin D (CITRACAL+D) 315-200 MG-UNIT per tablet, Take by mouth., Disp: , Rfl:  .  Cranberry 200 MG CAPS, Take by mouth., Disp: , Rfl:  .  ESTRACE VAGINAL 0.1 MG/GM vaginal cream, Apply small amount 3 times weekly, Disp: 42.5 g, Rfl: 2 .  etodolac (LODINE) 500 MG tablet, , Disp: , Rfl:  .  fexofenadine (ALLEGRA ALLERGY) 180 MG tablet, , Disp: , Rfl:  .  ibandronate (BONIVA) 150 MG tablet, Take 1 tablet (150 mg total) by mouth every 30 (thirty) days., Disp: 1 tablet, Rfl: 11 .  magnesium 30 MG tablet, Take 250 mg by mouth 1 day or 1 dose., Disp: , Rfl:  .  metoprolol succinate (TOPROL-XL) 50 MG 24 hr tablet, Take by mouth., Disp: , Rfl:  .  mometasone (ELOCON) 0.1 % cream, , Disp: , Rfl:  .  Multiple Vitamin (MULTIVITAMIN) capsule, Take 1 capsule by mouth daily., Disp: , Rfl:  .  naproxen sodium (RA NAPROXEN SODIUM) 220 MG tablet, Take 1 tablet by mouth as needed., Disp: , Rfl:  .  phenylephrine-shark liver oil-mineral oil-petrolatum (PREPARATION H) 0.25-3-14-71.9 % rectal ointment, Place 1 application rectally 2 (two) times daily as needed for hemorrhoids., Disp: , Rfl:  Allergies: Prednisone and Sulfa antibiotics  Review  of Systems  Constitutional: Negative for chills, fever and malaise/fatigue.  HENT: Negative for congestion, sinus pain and sore throat.   Eyes: Negative for blurred vision and pain.  Respiratory: Negative for cough and wheezing.   Cardiovascular: Negative for chest pain and leg swelling.  Gastrointestinal: Negative for abdominal pain, constipation, diarrhea, heartburn, nausea and vomiting.  Genitourinary: Negative for dysuria, frequency, hematuria and urgency.  Musculoskeletal: Negative for back pain, joint pain, myalgias and neck pain.  Skin: Negative for itching and rash.  Neurological: Negative for dizziness, tremors and weakness.  Endo/Heme/Allergies: Does not bruise/bleed easily.  Psychiatric/Behavioral: Negative for depression. The patient is not  nervous/anxious and does not have insomnia.     Objective: BP 130/80   Ht 5\' 1"  (1.549 m)   Wt 120 lb (54.4 kg)   BMI 22.67 kg/m   Filed Weights   05/01/20 1421  Weight: 120 lb (54.4 kg)   Body mass index is 22.67 kg/m. Physical Exam Constitutional:      General: She is not in acute distress.    Appearance: She is well-developed.  Genitourinary:     Pelvic exam was performed with patient supine.     Vagina, uterus and rectum normal.     No lesions in the vagina.     No vaginal bleeding.     No cervical motion tenderness, friability, lesion or polyp.     Uterus is mobile.     Uterus is not enlarged.     No uterine mass detected.    Uterus is midaxial.     No right or left adnexal mass present.     Right adnexa not tender.     Left adnexa not tender.  HENT:     Head: Normocephalic and atraumatic. No laceration.     Right Ear: Hearing normal.     Left Ear: Hearing normal.     Mouth/Throat:     Pharynx: Uvula midline.  Eyes:     Pupils: Pupils are equal, round, and reactive to light.  Neck:     Thyroid: No thyromegaly.  Cardiovascular:     Rate and Rhythm: Normal rate and regular rhythm.     Heart sounds: No murmur heard.  No friction rub. No gallop.   Pulmonary:     Effort: Pulmonary effort is normal. No respiratory distress.     Breath sounds: Normal breath sounds. No wheezing.  Chest:     Breasts:        Right: No mass, skin change or tenderness.        Left: No mass, skin change or tenderness.  Abdominal:     General: Bowel sounds are normal. There is no distension.     Palpations: Abdomen is soft.     Tenderness: There is no abdominal tenderness. There is no rebound.  Musculoskeletal:        General: Normal range of motion.     Cervical back: Normal range of motion and neck supple.  Neurological:     Mental Status: She is alert and oriented to person, place, and time.     Cranial Nerves: No cranial nerve deficit.  Skin:    General: Skin is warm and dry.   Psychiatric:        Judgment: Judgment normal.  Vitals reviewed.     Assessment: Annual Exam 1. Women's annual routine gynecological examination   2. Vulvar lesion   3. Encounter for screening mammogram for malignant neoplasm of breast   4. Anxiety  Plan:            1.  Cervical Screening-  Pap smear schedule reviewed with patient  2. Breast screening- Exam annually and mammogram scheduled  3. Colonoscopy every 5 years due to [place], has scheudled in OCT, Hemoccult testing after age 71  4. Labs managed by PCP  5. Counseling for hormonal therapy: no change in therapy today              6. FRAX - FRAX score for assessing the 10 year probability for fracture calculated and discussed today.  Based on age and score today, DEXA is not currently scheduled.    F/U  Return in about 1 year (around 05/01/2021) for Annual.  Barnett Applebaum, MD, [facility], [facility] 05/01/2020  3:13 PM

## 2020-05-31 ENCOUNTER — Other Ambulatory Visit: Payer: Self-pay

## 2020-05-31 ENCOUNTER — Ambulatory Visit
Admission: RE | Admit: 2020-05-31 | Discharge: 2020-05-31 | Disposition: A | Discharge status: Home / Self Care | Payer: Medicare PPO | Source: Ambulatory Visit | Attending: Obstetrics & Gynecology | Admitting: Obstetrics & Gynecology

## 2020-05-31 DIAGNOSIS — Z1231 Encounter for screening mammogram for malignant neoplasm of breast: Secondary | ICD-10-CM | POA: Diagnosis not present

## 2020-08-01 ENCOUNTER — Other Ambulatory Visit: Payer: Self-pay

## 2020-08-01 ENCOUNTER — Ambulatory Visit (INDEPENDENT_AMBULATORY_CARE_PROVIDER_SITE_OTHER): Payer: Medicare PPO | Admitting: Dermatology

## 2020-08-01 DIAGNOSIS — L578 Other skin changes due to chronic exposure to nonionizing radiation: Secondary | ICD-10-CM

## 2020-08-01 DIAGNOSIS — D229 Melanocytic nevi, unspecified: Secondary | ICD-10-CM

## 2020-08-01 DIAGNOSIS — Z1283 Encounter for screening for malignant neoplasm of skin: Secondary | ICD-10-CM

## 2020-08-01 DIAGNOSIS — L820 Inflamed seborrheic keratosis: Secondary | ICD-10-CM

## 2020-08-01 DIAGNOSIS — L570 Actinic keratosis: Secondary | ICD-10-CM

## 2020-08-01 DIAGNOSIS — Z86018 Personal history of other benign neoplasm: Secondary | ICD-10-CM | POA: Diagnosis not present

## 2020-08-01 DIAGNOSIS — L814 Other melanin hyperpigmentation: Secondary | ICD-10-CM

## 2020-08-01 DIAGNOSIS — Z85828 Personal history of other malignant neoplasm of skin: Secondary | ICD-10-CM

## 2020-08-01 DIAGNOSIS — D1800 Hemangioma unspecified site: Secondary | ICD-10-CM

## 2020-08-01 DIAGNOSIS — L821 Other seborrheic keratosis: Secondary | ICD-10-CM

## 2020-08-01 NOTE — Patient Instructions (Addendum)
  Cryotherapy Aftercare  . Wash gently with soap and water everyday.   Marland Kitchen Apply Vaseline and Band-Aid daily until healed.  Prior to procedure, discussed risks of blister formation, small wound, skin dyspigmentation, or rare scar following cryotherapy.   Recommend daily broad spectrum sunscreen SPF 30+ to sun-exposed areas, reapply every 2 hours as needed. Call for new or changing lesions.

## 2020-08-01 NOTE — Progress Notes (Signed)
Follow-Up Visit   Subjective  Tiffany [name] is a 69 y.o. female who presents for the following: Annual Exam (Yearly skin check. Hx BCC, Hx dysplastic nevi. ) and lesions (Back, chest, right leg, right upper arm. Patient c/o hard spots. Patient scratches at areas.). The patient presents for Total-Body Skin Exam (TBSE) for skin cancer screening and mole check.  The following portions of the chart were reviewed this encounter and updated as appropriate: Tobacco  Allergies  Meds  Problems  Med Hx  Surg Hx  Fam Hx     Review of Systems: No other skin or systemic complaints except as noted in HPI or Assessment and Plan.  Objective  Well appearing patient in no apparent distress; mood and affect are within normal limits.  A full examination was performed including scalp, head, eyes, ears, nose, lips, neck, chest, axillae, abdomen, back, buttocks, bilateral upper extremities, bilateral lower extremities, hands, feet, fingers, toes, fingernails, and toenails. All findings within normal limits unless otherwise noted below.  Objective  Left Tip of Nose x1, right side nose x1 (2): Erythematous thin papules/macules with gritty scale.   Objective  Chest - Medial, back: Erythematous keratotic or waxy stuck-on papule or plaque.   Assessment & Plan    Lentigines - Scattered tan macules - Discussed due to sun exposure - Benign, observe - Call for any changes  Seborrheic Keratoses - Stuck-on, waxy, tan-brown papules and plaques  - Discussed benign etiology and prognosis. - Observe - Call for any changes  Melanocytic Nevi - Tan-brown and/or pink-flesh-colored symmetric macules and papules - Benign appearing on exam today - Observation - Call clinic for new or changing moles - Recommend daily use of broad spectrum spf 30+ sunscreen to sun-exposed areas.   Hemangiomas - Red papules - Discussed benign nature - Observe - Call for any changes  Actinic Damage - Chronic, secondary to  cumulative UV/sun exposure - diffuse scaly erythematous macules with underlying dyspigmentation - Recommend daily broad spectrum sunscreen SPF 30+ to sun-exposed areas, reapply every 2 hours as needed.  - Call for new or changing lesions.  Skin cancer screening performed today.   History of Basal Cell Carcinoma of the Skin - No evidence of recurrence today at right deltoid - Recommend regular full body skin exams - Recommend daily broad spectrum sunscreen SPF 30+ to sun-exposed areas, reapply every 2 hours as needed.  - Call if any new or changing lesions are noted between office visits  History of Dysplastic Nevi - No evidence of recurrence today - Recommend regular full body skin exams - Recommend daily broad spectrum sunscreen SPF 30+ to sun-exposed areas, reapply every 2 hours as needed.  - Call if any new or changing lesions are noted between office visits  AK (actinic keratosis) (2) Left Tip of Nose x1, right side nose x1 Destruction of lesion - Left Tip of Nose x1, right side nose x1 Complexity: simple   Destruction method: cryotherapy   Informed consent: discussed and consent obtained   Timeout:  patient name, date of birth, surgical site, and procedure verified Lesion destroyed using liquid nitrogen: Yes   Region frozen until ice ball extended beyond lesion: Yes   Outcome: patient tolerated procedure well with no complications   Post-procedure details: wound care instructions given    Inflamed seborrheic keratosis Chest - Medial, back Patient defers treatment until after colonoscopy.  Return in about 3 months (around 11/01/2020) for AK recheck, Tx ISKs.   I, Emelia Salisbury, CMA, am  acting as scribe for Sarina Ser, MD.  Documentation: I have reviewed the above documentation for accuracy and completeness, and I agree with the above.  Sarina Ser, MD

## 2020-08-02 ENCOUNTER — Encounter: Payer: Self-pay | Admitting: Dermatology

## 2020-08-13 ENCOUNTER — Other Ambulatory Visit: Payer: Self-pay

## 2020-08-13 ENCOUNTER — Other Ambulatory Visit
Admission: RE | Admit: 2020-08-13 | Discharge: 2020-08-13 | Disposition: A | Discharge status: Home / Self Care | Payer: Medicare PPO | Source: Ambulatory Visit | Attending: Internal Medicine | Admitting: Internal Medicine

## 2020-08-13 DIAGNOSIS — Z01812 Encounter for preprocedural laboratory examination: Secondary | ICD-10-CM | POA: Insufficient documentation

## 2020-08-13 DIAGNOSIS — Z20822 Contact with and (suspected) exposure to covid-19: Secondary | ICD-10-CM | POA: Diagnosis not present

## 2020-08-14 ENCOUNTER — Encounter: Payer: Self-pay | Admitting: Internal Medicine

## 2020-08-14 LAB — SARS CORONAVIRUS 2 (TAT 6-24 HRS): SARS Coronavirus 2: NEGATIVE

## 2020-08-15 ENCOUNTER — Encounter
Admission: RE | Disposition: A | Discharge status: Home / Self Care | Payer: Self-pay | Source: Home / Self Care | Attending: Internal Medicine

## 2020-08-15 ENCOUNTER — Other Ambulatory Visit: Payer: Self-pay

## 2020-08-15 ENCOUNTER — Encounter: Payer: Self-pay | Admitting: Internal Medicine

## 2020-08-15 ENCOUNTER — Ambulatory Visit: Payer: Medicare PPO | Admitting: Anesthesiology

## 2020-08-15 ENCOUNTER — Ambulatory Visit
Admission: RE | Admit: 2020-08-15 | Discharge: 2020-08-15 | Disposition: A | Discharge status: Home / Self Care | Payer: Medicare PPO | Attending: Internal Medicine | Admitting: Internal Medicine

## 2020-08-15 DIAGNOSIS — Z1211 Encounter for screening for malignant neoplasm of colon: Secondary | ICD-10-CM | POA: Diagnosis present

## 2020-08-15 DIAGNOSIS — Z800 Family history of malignant neoplasm of digestive organs: Secondary | ICD-10-CM | POA: Insufficient documentation

## 2020-08-15 DIAGNOSIS — K5730 Diverticulosis of large intestine without perforation or abscess without bleeding: Secondary | ICD-10-CM | POA: Diagnosis not present

## 2020-08-15 DIAGNOSIS — Z79899 Other long term (current) drug therapy: Secondary | ICD-10-CM | POA: Insufficient documentation

## 2020-08-15 HISTORY — DX: Prediabetes: R73.03

## 2020-08-15 HISTORY — DX: Essential (primary) hypertension: I10

## 2020-08-15 HISTORY — PX: COLONOSCOPY WITH PROPOFOL: SHX5780

## 2020-08-15 HISTORY — DX: Anxiety disorder, unspecified: F41.9

## 2020-08-15 SURGERY — COLONOSCOPY WITH PROPOFOL
Anesthesia: General

## 2020-08-15 MED ORDER — PROPOFOL 500 MG/50ML IV EMUL
INTRAVENOUS | Status: DC | PRN
  Administered 2020-08-15: 130 ug/kg/min via INTRAVENOUS

## 2020-08-15 MED ORDER — LIDOCAINE HCL (CARDIAC) PF 100 MG/5ML IV SOSY
PREFILLED_SYRINGE | INTRAVENOUS | Status: DC | PRN
  Administered 2020-08-15: 50 mg via INTRAVENOUS

## 2020-08-15 MED ORDER — PROPOFOL 500 MG/50ML IV EMUL
INTRAVENOUS | Status: AC
  Filled 2020-08-15: qty 50

## 2020-08-15 MED ORDER — SODIUM CHLORIDE 0.9 % IV SOLN: INTRAVENOUS | Status: DC

## 2020-08-15 MED ORDER — PROPOFOL 10 MG/ML IV BOLUS
INTRAVENOUS | Status: DC | PRN
  Administered 2020-08-15: 80 mg via INTRAVENOUS

## 2020-08-15 NOTE — Op Note (Signed)
[facility] Gastroenterology Patient Name: Tiffany [name] Procedure Date: 08/15/2020 1:11 PM MRN: [id number] Account #: [id number] Date of Birth: Jan 09, 1951 Admit Type: Outpatient Age: 69 Room: [facility] ENDO ROOM 2 Gender: Female Note Status: Finalized Procedure:             Colonoscopy Indications:           Screening in patient at increased risk: Family history                         of 1st-degree relative with colorectal cancer Providers:             Benay Pike. Alice Reichert MD, MD Referring MD:          Christena Flake. Raechel Ache, MD (Referring MD) Medicines:             Propofol per Anesthesia Complications:         No immediate complications. Procedure:             Pre-Anesthesia Assessment:                        - The risks and benefits of the procedure and the                         sedation options and risks were discussed with the                         patient. All questions were answered and informed                         consent was obtained.                        - Patient identification and proposed procedure were                         verified prior to the procedure by the nurse. The                         procedure was verified in the procedure room.                        - ASA Grade Assessment: III - A patient with severe                         systemic disease.                        - After reviewing the risks and benefits, the patient                         was deemed in satisfactory condition to undergo the                         procedure.                        After obtaining informed consent, the colonoscope was                         passed under  direct vision. Throughout the procedure,                         the patient's blood pressure, pulse, and oxygen                         saturations were monitored continuously. The                         Colonoscope was introduced through the anus and                         advanced to the the  cecum, identified by appendiceal                         orifice and ileocecal valve. The colonoscopy was                         performed without difficulty. The quality of the bowel                         preparation was good. The ileocecal valve, appendiceal                         orifice, and rectum were photographed. The colonoscopy                         was somewhat difficult due to a redundant colon and a                         tortuous colon. Successful completion of the procedure                         was aided by straightening and shortening the scope to                         obtain bowel loop reduction. The patient tolerated the                         procedure well. The quality of the bowel preparation                         was good. The ileocecal valve, appendiceal orifice,                         and rectum were photographed. Findings:      The perianal and digital rectal examinations were normal. Pertinent       negatives include normal sphincter tone and no palpable rectal lesions.      A few small-mouthed diverticula were found in the sigmoid colon.      The retroflexed view of the distal rectum and anal verge was normal and       showed no anal or rectal abnormalities. Estimated blood loss: none.      Normal mucosa was found in the entire colon.      There is no endoscopic evidence of inflammation, mass or polyps in the       entire colon. Impression:            -  Diverticulosis in the sigmoid colon.                        - The distal rectum and anal verge are normal on                         retroflexion view.                        - No specimens collected. Recommendation:        - Patient has a contact number available for                         emergencies. The signs and symptoms of potential                         delayed complications were discussed with the patient.                         Return to normal activities tomorrow. Written                          discharge instructions were provided to the patient.                        - Resume previous diet.                        - Continue present medications.                        - Repeat colonoscopy in 5 years for screening purposes.                        - Return to GI office PRN.                        - The findings and recommendations were discussed with                         the patient. Procedure Code(s):     --- Professional ---                        P3790, Colorectal cancer screening; colonoscopy on                         individual at high risk Diagnosis Code(s):     --- Professional ---                        K57.30, Diverticulosis of large intestine without                         perforation or abscess without bleeding                        Z80.0, Family history of malignant neoplasm of                         digestive organs CPT copyright 2019 [employer]. All rights  reserved. The codes documented in this report are preliminary and upon coder review may  be revised to meet current compliance requirements. Efrain Sella MD, MD 08/15/2020 1:42:34 PM This report has been signed electronically. Number of Addenda: 0 Note Initiated On: 08/15/2020 1:11 PM Scope Withdrawal Time: 0 hours 5 minutes 16 seconds  Total Procedure Duration: 0 hours 16 minutes 10 seconds  Estimated Blood Loss:  Estimated blood loss: none. Estimated blood loss: none.      [facility]

## 2020-08-15 NOTE — H&P (Signed)
Outpatient short stay form Pre-procedure 08/15/2020 11:57 AM Melodee Lupe K. Alice Reichert, M.D.  Primary Physician: Ezequiel Kayser, M.D.  Reason for visit:  Family history of colon cancer (Mother).  History of present illness:  69 year old patient presenting for family history of colon cancer. Patient denies any change in bowel habits, rectal bleeding or involuntary weight loss.    No current facility-administered medications for this encounter.  Current Outpatient Medications:  .  acetaminophen (TYLENOL) 325 MG tablet, Take 325 mg by mouth every 6 (six) hours as needed for mild pain., Disp: , Rfl:  .  b complex vitamins capsule, Take 1 capsule by mouth daily., Disp: , Rfl:  .  calcium-vitamin D (OSCAL WITH D) 500-200 MG-UNIT tablet, Take 1 tablet by mouth., Disp: , Rfl:  .  cholecalciferol (VITAMIN D3) 10 MCG (400 UNIT) TABS tablet, Take 1,000 Units by mouth daily., Disp: , Rfl:  .  cyclobenzaprine (FLEXERIL) 5 MG tablet, Take 2.5 mg by mouth at bedtime as needed for muscle spasms., Disp: , Rfl:  .  diphenhydrAMINE (BENADRYL) 25 mg capsule, Take 25 mg by mouth every 6 (six) hours as needed., Disp: , Rfl:  .  gentamicin ointment (GARAMYCIN) 0.1 %, Apply 1 application topically 2 (two) times daily as needed (intranasal as needed for sores)., Disp: , Rfl:  .  ibuprofen (ADVIL) 200 MG tablet, Take 200 mg by mouth every 6 (six) hours as needed for moderate pain., Disp: , Rfl:  .  Lactobacillus Acidophilus POWD, Take 1 capsule by mouth 2 (two) times daily., Disp: , Rfl:  .  omeprazole (PRILOSEC) 20 MG capsule, Take 20 mg by mouth daily as needed., Disp: , Rfl:  .  ALPRAZolam (XANAX) 0.25 MG tablet, Take 1 tablet (0.25 mg total) by mouth as needed. (Patient taking differently: Take 0.25 mg by mouth at bedtime as needed for sleep (insomnia). ), Disp: 30 tablet, Rfl: 2 .  azelastine (ASTELIN) 0.1 % nasal spray, Place 1-2 sprays into the nose 2 (two) times daily as needed for allergies. , Disp: , Rfl:  .  Biotin  1000 MCG tablet, Take 1 tablet by mouth daily., Disp: , Rfl:  .  calcium citrate-vitamin D (CITRACAL+D) 315-200 MG-UNIT per tablet, Take 1 tablet by mouth 2 (two) times daily. Take 1 tablet in am, 2 tablet in pm, Disp: , Rfl:  .  Cranberry 200 MG CAPS, Take 2 capsules by mouth 2 (two) times daily. , Disp: , Rfl:  .  ESTRACE VAGINAL 0.1 MG/GM vaginal cream, Apply small amount 3 times weekly, Disp: 42.5 g, Rfl: 2 .  etodolac (LODINE) 500 MG tablet, Take 500 mg by mouth 2 (two) times daily as needed. , Disp: , Rfl:  .  fexofenadine (ALLEGRA ALLERGY) 180 MG tablet, Take 180 mg by mouth daily as needed for allergies. , Disp: , Rfl:  .  ibandronate (BONIVA) 150 MG tablet, Take 1 tablet (150 mg total) by mouth every 30 (thirty) days., Disp: 1 tablet, Rfl: 11 .  magnesium 30 MG tablet, Take 250 mg by mouth daily. , Disp: , Rfl:  .  metoprolol succinate (TOPROL-XL) 50 MG 24 hr tablet, Take by mouth daily. , Disp: , Rfl:  .  mometasone (ELOCON) 0.1 % cream, , Disp: , Rfl:  .  Multiple Vitamin (MULTIVITAMIN) capsule, Take 1 capsule by mouth daily., Disp: , Rfl:  .  naproxen sodium (RA NAPROXEN SODIUM) 220 MG tablet, Take 1 tablet by mouth 2 (two) times daily as needed (pain). , Disp: ,  Rfl:  .  phenylephrine-shark liver oil-mineral oil-petrolatum (PREPARATION H) 0.25-3-14-71.9 % rectal ointment, Place 1 application rectally 2 (two) times daily as needed for hemorrhoids., Disp: , Rfl:   No medications prior to admission.     Allergies  Allergen Reactions  . Prednisone Other (See Comments)    Body aches with oral prednisone  . Sulfa Antibiotics Nausea Only     Past Medical History:  Diagnosis Date  . Allergy   . Anxiety   . Basal cell carcinoma   . Basal cell carcinoma (BCC) of right upper arm 04/27/2008   R deltoid   . DDD (degenerative disc disease), lumbar 08/05/2018  . History of dysplastic nevus 07/21/2019   R lower quad abd 46NG inf lat to umbilicus/moderate atypia   . Hx of dysplastic  nevus 06/21/2015   L distal lat deltoid/ mod atypia   . Hx of dysplastic nevus 06/21/2015   R distal ant thigh/ severe atypa   . Hx of dysplastic nevus 06/15/2014   left side above waist/ mild atypia  . Hx of dysplastic nevus 06/21/2009   Left ant axilla/ slight atypia  . Hypercholesterolemia 08/05/2019  . Hypertension   . IBS (irritable bowel syndrome)   . Osteopenia   . Plantar fasciitis   . Prediabetes     Review of systems:  Otherwise negative.    Physical Exam  Gen: Alert, oriented. Appears stated age.  HEENT: [place]/AT. PERRLA. Lungs: CTA, no wheezes. CV: RR nl S1, S2. Abd: soft, benign, no masses. BS+ Ext: No edema. Pulses 2+    Planned procedures: Proceed with colonoscopy. The patient understands the nature of the planned procedure, indications, risks, alternatives and potential complications including but not limited to bleeding, infection, perforation, damage to internal organs and possible oversedation/side effects from anesthesia. The patient agrees and gives consent to proceed.  Please refer to procedure notes for findings, recommendations and patient disposition/instructions.     Reeda Soohoo K. Alice Reichert, M.D. Gastroenterology 08/15/2020  11:57 AM

## 2020-08-15 NOTE — Transfer of Care (Signed)
Immediate Anesthesia Transfer of Care Note  Patient: Tiffany [name]  Procedure(s) Performed: COLONOSCOPY WITH PROPOFOL (N/A )  Patient Location: PACU and Endoscopy Unit  Anesthesia Type:General  Level of Consciousness: drowsy  Airway & Oxygen Therapy: Patient Spontanous Breathing  Post-op Assessment: Report given to RN and Post -op Vital signs reviewed and stable  Post vital signs: Reviewed and stable  Last Vitals:  Vitals Value Taken Time  BP 101/58 08/15/20 1342  Temp    Pulse 60 08/15/20 1342  Resp 24 08/15/20 1342  SpO2 96 % 08/15/20 1342  Vitals [name] include unvalidated device data.  Last Pain:  Vitals:   08/15/20 1216  TempSrc: Temporal  PainSc: 0-No pain         Complications: No complications documented.

## 2020-08-15 NOTE — Anesthesia Preprocedure Evaluation (Signed)
Anesthesia Evaluation  Patient identified by MRN, date of birth, ID band Patient awake    Reviewed: Allergy & Precautions, NPO status , Patient's Chart, lab work & pertinent test results  History of Anesthesia Complications Negative for: history of anesthetic complications  Airway Mallampati: II  TM Distance: >3 FB Neck ROM: Full    Dental  (+) Implants   Pulmonary neg pulmonary ROS, neg sleep apnea, neg COPD,    breath sounds clear to auscultation- rhonchi (-) wheezing      Cardiovascular hypertension, Pt. on medications (-) CAD, (-) Past MI, (-) Cardiac Stents and (-) CABG  Rhythm:Regular Rate:Normal - Systolic murmurs and - Diastolic murmurs    Neuro/Psych neg Seizures Anxiety negative neurological ROS     GI/Hepatic negative GI ROS, Neg liver ROS,   Endo/Other  negative endocrine ROSneg diabetes  Renal/GU negative Renal ROS     Musculoskeletal  (+) Arthritis ,   Abdominal (+) - obese,   Peds  Hematology negative hematology ROS (+)   Anesthesia Other Findings Past Medical History: No date: Allergy No date: Anxiety No date: Basal cell carcinoma 04/27/2008: Basal cell carcinoma (BCC) of right upper arm     Comment:  R deltoid  08/05/2018: DDD (degenerative disc disease), lumbar 07/21/2019: History of dysplastic nevus     Comment:  R lower quad abd 95KD inf lat to umbilicus/moderate               atypia  06/21/2015: Hx of dysplastic nevus     Comment:  L distal lat deltoid/ mod atypia  06/21/2015: Hx of dysplastic nevus     Comment:  R distal ant thigh/ severe atypa  06/15/2014: Hx of dysplastic nevus     Comment:  left side above waist/ mild atypia 06/21/2009: Hx of dysplastic nevus     Comment:  Left ant axilla/ slight atypia 08/05/2019: Hypercholesterolemia No date: Hypertension No date: IBS (irritable bowel syndrome) No date: Osteopenia No date: Plantar fasciitis No date: Prediabetes    Reproductive/Obstetrics                             Anesthesia Physical Anesthesia Plan  ASA: II  Anesthesia Plan: General   Post-op Pain Management:    Induction: Intravenous  PONV Risk Score and Plan: 2 and Propofol infusion  Airway Management Planned: Natural Airway  Additional Equipment:   Intra-op Plan:   Post-operative Plan:   Informed Consent: I have reviewed the patients History and Physical, chart, labs and discussed the procedure including the risks, benefits and alternatives for the proposed anesthesia with the patient or authorized representative who has indicated his/her understanding and acceptance.     Dental advisory given  Plan Discussed with: CRNA and Anesthesiologist  Anesthesia Plan Comments:         Anesthesia Quick Evaluation

## 2020-08-15 NOTE — Interval H&P Note (Signed)
History and Physical Interval Note:  08/15/2020 12:41 PM  Tiffany [name]  has presented today for surgery, with the diagnosis of FM HX CCA.  The various methods of treatment have been discussed with the patient and family. After consideration of risks, benefits and other options for treatment, the patient has consented to  Procedure(s): COLONOSCOPY WITH PROPOFOL (N/A) as a surgical intervention.  The patient's history has been reviewed, patient examined, no change in status, stable for surgery.  I have reviewed the patient's chart and labs.  Questions were answered to the patient's satisfaction.     [place], [place]

## 2020-08-15 NOTE — Anesthesia Postprocedure Evaluation (Signed)
Anesthesia Post Note  Patient: Tiffany [name]  Procedure(s) Performed: COLONOSCOPY WITH PROPOFOL (N/A )  Patient location during evaluation: Endoscopy Anesthesia Type: General Level of consciousness: awake and alert and oriented Pain management: pain level controlled Vital Signs Assessment: post-procedure vital signs reviewed and stable Respiratory status: spontaneous breathing, nonlabored ventilation and respiratory function stable Cardiovascular status: blood pressure returned to baseline and stable Postop Assessment: no signs of nausea or vomiting Anesthetic complications: no   No complications documented.   Last Vitals:  Vitals:   08/15/20 1402 08/15/20 1412  BP: 111/61 127/69  Pulse: 62 (!) 58  Resp: 14 16  Temp:    SpO2: 99% 100%    Last Pain:  Vitals:   08/15/20 1412  TempSrc:   PainSc: 0-No pain                 Letizia Hook

## 2020-11-15 ENCOUNTER — Ambulatory Visit: Payer: Medicare PPO | Admitting: Dermatology

## 2020-11-15 ENCOUNTER — Other Ambulatory Visit: Payer: Self-pay

## 2020-11-15 DIAGNOSIS — L719 Rosacea, unspecified: Secondary | ICD-10-CM

## 2020-11-15 DIAGNOSIS — L820 Inflamed seborrheic keratosis: Secondary | ICD-10-CM | POA: Diagnosis not present

## 2020-11-15 DIAGNOSIS — D230 Other benign neoplasm of skin of lip: Secondary | ICD-10-CM | POA: Diagnosis not present

## 2020-11-15 DIAGNOSIS — D239 Other benign neoplasm of skin, unspecified: Secondary | ICD-10-CM

## 2020-11-15 DIAGNOSIS — L720 Epidermal cyst: Secondary | ICD-10-CM

## 2020-11-15 DIAGNOSIS — L578 Other skin changes due to chronic exposure to nonionizing radiation: Secondary | ICD-10-CM

## 2020-11-15 NOTE — Progress Notes (Signed)
   Follow-Up Visit   Subjective  Tiffany [name] is a 70 y.o. female who presents for the following: Actinic Keratosis (Face, 76m f/u) and ISKs to treat (Chest and back, to treat today).  She has a spot on her nose to be checked at that looks like a open pore that gets a plug in it.  She also gets little pimples on her nose periodically.  She also has white bumps on her face she would like checked.  The following portions of the chart were reviewed this encounter and updated as appropriate:   Tobacco  Allergies  Meds  Problems  Med Hx  Surg Hx  Fam Hx     Review of Systems:  No other skin or systemic complaints except as noted in HPI or Assessment and Plan.  Objective  Well appearing patient in no apparent distress; mood and affect are within normal limits.  A focused examination was performed including face, chest, back. Relevant physical exam findings are noted in the Assessment and Plan.  Objective  L nasal tip: Open comedone  Objective  nose: Pink paps nose  Objective  face: White paps  Objective  back x 7, chest x 12 (19): Erythematous keratotic or waxy stuck-on papule or plaque.    Assessment & Plan    Actinic Damage - chronic, secondary to cumulative UV radiation exposure/sun exposure over time - diffuse scaly erythematous macules with underlying dyspigmentation - Recommend daily broad spectrum sunscreen SPF 30+ to sun-exposed areas, reapply every 2 hours as needed.  - Call for new or changing lesions.  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 Start Epiduo forte qhs to aa nose sample x 2 Lot [id number] exp 12/22  Rosacea nose Rosacea is a chronic progressive skin condition usually affecting the face of adults, causing redness and/or acne bumps. It is treatable but not curable. It sometimes affects the eyes (ocular rosacea) as well. It may respond to topical and/or systemic medication and can flare with stress, sun exposure, alcohol, exercise and some  foods.  Daily application of broad spectrum spf 30+ sunscreen to face is recommended to reduce flares.  Start [facility] Triple cream qhs  Milia face Start Epiduo forte qhs  Inflamed seborrheic keratosis (19) back x 7, chest x 12  Destruction of lesion - back x 7, chest x 12 Complexity: simple   Destruction method: cryotherapy   Informed consent: discussed and consent obtained   Timeout:  patient name, date of birth, surgical site, and procedure verified Lesion destroyed using liquid nitrogen: Yes   Region frozen until ice ball extended beyond lesion: Yes   Outcome: patient tolerated procedure well with no complications   Post-procedure details: wound care instructions given    Return in about 1 year (around 11/15/2021) for TBSE, Hx of AKs, Hx of BCC, Hx of Dysplastic nevi.  I, Othelia Pulling, RMA, am acting as scribe for Sarina Ser, MD .  Documentation: I have reviewed the above documentation for accuracy and completeness, and I agree with the above.  Sarina Ser, MD

## 2020-11-15 NOTE — Patient Instructions (Addendum)
Epiduo forte for left nasal tip and white bumps, use small amount nightly   Instructions for [facility] Medications  One or more of your medications was sent to the [facility] mail order compounding pharmacy. You will receive an email from them and can purchase the medicine through that link. It will then be mailed to your home at the address you confirmed. If for any reason you do not receive an email from them, please check your spam folder. If you still do not find the email, please let us know. [facility] phone number is [phone].

## 2020-11-18 ENCOUNTER — Encounter: Payer: Self-pay | Admitting: Dermatology

## 2020-12-25 ENCOUNTER — Telehealth: Payer: Self-pay

## 2020-12-25 NOTE — Telephone Encounter (Signed)
Patient called in stating that the area she has been using Epiduo Forte on under nose has gotten larger and more of a "knot" feeling since she has started the medication. She has been applying nightly since February with no improvement. Patient states she stopped using about a week ago and the area has gotten smaller since then. Please advise.

## 2020-12-25 NOTE — Telephone Encounter (Signed)
I would make pt appt for recheck. May need alternative treatment (injection?) vs biopsy.

## 2020-12-25 NOTE — Telephone Encounter (Signed)
Patient advised and scheduled.  

## 2020-12-27 ENCOUNTER — Other Ambulatory Visit: Payer: Self-pay

## 2020-12-27 ENCOUNTER — Encounter: Payer: Self-pay | Admitting: Dermatology

## 2020-12-27 ENCOUNTER — Ambulatory Visit: Payer: Medicare PPO | Admitting: Dermatology

## 2020-12-27 DIAGNOSIS — L820 Inflamed seborrheic keratosis: Secondary | ICD-10-CM | POA: Diagnosis not present

## 2020-12-27 DIAGNOSIS — B079 Viral wart, unspecified: Secondary | ICD-10-CM | POA: Diagnosis not present

## 2020-12-27 DIAGNOSIS — L720 Epidermal cyst: Secondary | ICD-10-CM | POA: Diagnosis not present

## 2020-12-27 NOTE — Patient Instructions (Addendum)
If you have any questions or concerns for your doctor, please call our main line at [phone] and press option 4 to reach your doctor's medical assistant. If no one answers, please leave a voicemail as directed and we will return your call as soon as possible. Messages left after 4 pm will be answered the following business day.   You may also send us a message via MyChart. We typically respond to MyChart messages within 1-2 business days.  For prescription refills, please ask your pharmacy to contact our office. Our fax number is [phone].  If you have an urgent issue when the clinic is closed that cannot wait until the next business day, you can page your doctor at the number below.    Please note that while we do our best to be available for urgent issues outside of office hours, we are not available 24/7.   If you have an urgent issue and are unable to reach us, you may choose to seek medical care at your doctor's office, retail clinic, urgent care center, or emergency room.  If you have a medical emergency, please immediately call 911 or go to the emergency department.  Pager Numbers  - Dr. Kowalski: [phone]  - Dr. Moye: [phone]  - Dr. Stewart: [phone]  In the event of inclement weather, please call our main line at [phone] for an update on the status of any delays or closures.  Dermatology Medication Tips: Please keep the boxes that topical medications come in in order to help keep track of the instructions about where and how to use these. Pharmacies typically print the medication instructions only on the boxes and not directly on the medication tubes.   If your medication is too expensive, please contact our office at [phone] option 4 or send us a message through MyChart.   We are unable to tell what your co-pay for medications will be in advance as this is different depending on your insurance coverage. However, we may be able to find a  substitute medication at lower cost or fill out paperwork to get insurance to cover a needed medication.   If a prior authorization is required to get your medication covered by your insurance company, please allow us 1-2 business days to complete this process.  Drug prices often vary depending on where the prescription is filled and some pharmacies may offer cheaper prices.  The website www.goodrx.com contains coupons for medications through different pharmacies. The prices here do not account for what the cost may be with help from insurance (it may be cheaper with your insurance), but the website can give you the price if you did not use any insurance.  - You can print the associated coupon and take it with your prescription to the pharmacy.  - You may also stop by our office during regular business hours and pick up a GoodRx coupon card.  - If you need your prescription sent electronically to a different pharmacy, notify our office through [place] MyChart or by phone at [phone] option 4.     Wound Care Instructions  1. Cleanse wound gently with soap and water once a day then pat dry with clean gauze. Apply a thing coat of Petrolatum (petroleum jelly, "Vaseline") over the wound (unless you have an allergy to this). We recommend that you use a new, sterile tube of Vaseline. Do not pick or remove scabs. Do not remove the yellow or white "healing tissue" from the base of the wound.    2. Cover the wound with fresh, clean, nonstick gauze and secure with paper tape. You may use Band-Aids in place of gauze and tape if the would is small enough, but would recommend trimming much of the tape off as there is often too much. Sometimes Band-Aids can irritate the skin.  3. You should call the office for your biopsy report after 1 week if you have not already been contacted.  4. If you experience any problems, such as abnormal amounts of bleeding, swelling, significant bruising, significant pain,  or evidence of infection, please call the office immediately.  5. FOR ADULT SURGERY PATIENTS: If you need something for pain relief you may take 1 extra strength Tylenol (acetaminophen) AND 2 Ibuprofen (200mg each) together every 4 hours as needed for pain. (do not take these if you are allergic to them or if you have a reason you should not take them.) Typically, you may only need pain medication for 1 to 3 days.     

## 2020-12-27 NOTE — Progress Notes (Signed)
   Follow-Up Visit   Subjective  [name] is a 70 y.o. female who presents for the following: check spot (Under R nostril, used Epiduo forte on it), milia (Face, not using Epiduo forte bc it got inflamed), and check white spot (Inframammary, ).  Also has inflamed seborrheic keratosis of the inframammary area she would like treated.  She also has been using Epiduo forte on the milium upper face.  He wonders about continuing or not.  The following portions of the chart were reviewed this encounter and updated as appropriate:   Tobacco  Allergies  Meds  Problems  Med Hx  Surg Hx  Fam Hx     Review of Systems:  No other skin or systemic complaints except as noted in HPI or Assessment and Plan.  Objective  Well appearing patient in no apparent distress; mood and affect are within normal limits.  A focused examination was performed including face, inframammary. Relevant physical exam findings are noted in the Assessment and Plan.  Objective  face: White paps face  Objective  R infra nasal: 0.6cm white pap  Objective  Bil Inframammary Fold x 17 (17): Erythematous keratotic or waxy stuck-on papule or plaque.   Assessment & Plan  Milia face Chronic and persistent.   Cont Epiduo forte qhs to aa face as tolerated  Epidermal cyst with inflammation versus viral wart versus flame seborrheic keratosis R infra nasal Epidermal / dermal shaving - R infra nasal  Lesion diameter (cm):  0.6 Informed consent: discussed and consent obtained   Timeout: patient name, date of birth, surgical site, and procedure verified   Procedure prep:  Patient was prepped and draped in usual sterile fashion Prep type:  Isopropyl alcohol Anesthesia: the lesion was anesthetized in a standard fashion   Anesthetic:  1% lidocaine w/ epinephrine 1-100,000 buffered w/ 8.4% NaHCO3 Instrument used: flexible razor blade   Hemostasis achieved with: pressure, aluminum chloride and electrodesiccation   Outcome:  patient tolerated procedure well   Post-procedure details: sterile dressing applied and wound care instructions given   Dressing type: bandage and petrolatum    Specimen 1 - Surgical pathology Differential Diagnosis: D48.5 Cyst vs Wart vs other  Check Margins: No 0.6cm white pap  Inflamed seborrheic keratosis (17) Bil Inframammary Fold x 17 Destruction of lesion - Bil Inframammary Fold x 17 Complexity: simple   Destruction method: cryotherapy   Informed consent: discussed and consent obtained   Timeout:  patient name, date of birth, surgical site, and procedure verified Lesion destroyed using liquid nitrogen: Yes   Region frozen until ice ball extended beyond lesion: Yes   Outcome: patient tolerated procedure well with no complications   Post-procedure details: wound care instructions given    Return for as scheduled .   I, Othelia Pulling, RMA, am acting as scribe for Sarina Ser, MD .  Documentation: I have reviewed the above documentation for accuracy and completeness, and I agree with the above.  Sarina Ser, MD

## 2021-01-01 ENCOUNTER — Telehealth: Payer: Self-pay

## 2021-01-01 NOTE — Telephone Encounter (Signed)
Discussed biopsy results with pt  °

## 2021-01-01 NOTE — Telephone Encounter (Signed)
-----   Message from Ralene Bathe, MD sent at 12/30/2020  1:34 PM EDT ----- Diagnosis Skin , right infra nasal VERRUCA VULGARIS, IRRITATED, BASE INVOLVED  Benign viral wart No further treatment needed unless recurs - observe for recurrence

## 2021-05-06 ENCOUNTER — Other Ambulatory Visit: Payer: Self-pay

## 2021-05-06 ENCOUNTER — Encounter: Payer: Self-pay | Admitting: Obstetrics & Gynecology

## 2021-05-06 ENCOUNTER — Other Ambulatory Visit (HOSPITAL_COMMUNITY)
Admission: RE | Admit: 2021-05-06 | Discharge: 2021-05-06 | Disposition: A | Discharge status: Home / Self Care | Payer: Medicare PPO | Source: Ambulatory Visit | Attending: Obstetrics & Gynecology | Admitting: Obstetrics & Gynecology

## 2021-05-06 ENCOUNTER — Ambulatory Visit (INDEPENDENT_AMBULATORY_CARE_PROVIDER_SITE_OTHER): Payer: Medicare PPO | Admitting: Obstetrics & Gynecology

## 2021-05-06 VITALS — BP 120/80 | Ht 61.0 in | Wt 119.0 lb

## 2021-05-06 DIAGNOSIS — Z01419 Encounter for gynecological examination (general) (routine) without abnormal findings: Secondary | ICD-10-CM

## 2021-05-06 DIAGNOSIS — Z1151 Encounter for screening for human papillomavirus (HPV): Secondary | ICD-10-CM | POA: Diagnosis not present

## 2021-05-06 DIAGNOSIS — N9089 Other specified noninflammatory disorders of vulva and perineum: Secondary | ICD-10-CM

## 2021-05-06 DIAGNOSIS — Z1231 Encounter for screening mammogram for malignant neoplasm of breast: Secondary | ICD-10-CM

## 2021-05-06 DIAGNOSIS — Z124 Encounter for screening for malignant neoplasm of cervix: Secondary | ICD-10-CM | POA: Insufficient documentation

## 2021-05-06 DIAGNOSIS — F419 Anxiety disorder, unspecified: Secondary | ICD-10-CM

## 2021-05-06 MED ORDER — ESTRACE 0.1 MG/GM VA CREA
TOPICAL_CREAM | VAGINAL | 2 refills | Status: DC
Start: 2021-05-06 — End: 2022-05-19

## 2021-05-06 MED ORDER — ALPRAZOLAM 0.25 MG PO TABS: 0.2500 mg | ORAL_TABLET | ORAL | 2 refills | Status: AC | PRN

## 2021-05-06 NOTE — Patient Instructions (Signed)
PAP every three years Mammogram every year    Call [phone] to schedule at [facility] Colonoscopy every 5 years Labs yearly (with PCP)  Thank you for choosing [facility]. As part of our ongoing efforts to improve patient experience, we would appreciate your feedback. Please fill out the short survey that you will receive by mail or MyChart. Your opinion is important to [place]! - Dr. Kenton Kingfisher

## 2021-05-06 NOTE — Progress Notes (Signed)
HPI:      [name] is a 70 y.o. G0P0000 who LMP was in the past, she presents today for her annual examination.  The patient has no complaints today.  No s/sx menopause. The patient is not sexually active. Herlast pap: approximate date 2019 and was normal and last mammogram: approximate date 2021 and was normal.  The patient does perform self breast exams.  There is no notable family history of breast or ovarian cancer in her family. The patient is taking hormone replacement therapy (VAG ERT). Patient denies post-menopausal vaginal bleeding.   The patient has regular exercise: yes. The patient denies current symptoms of depression.    GYN Hx: Last Colonoscopy: 6 mos  ago. Normal.   PMHx: Past Medical History:  Diagnosis Date   Allergy    Anxiety    Basal cell carcinoma    Basal cell carcinoma (BCC) of right upper arm 04/27/2008   R deltoid    DDD (degenerative disc disease), lumbar 08/05/2018   History of dysplastic nevus 07/21/2019   R lower quad abd 0000000 inf lat to umbilicus/moderate atypia    Hx of dysplastic nevus 06/21/2015   L distal lat deltoid/ mod atypia    Hx of dysplastic nevus 06/21/2015   R distal ant thigh/ severe atypa    Hx of dysplastic nevus 06/15/2014   left side above waist/ mild atypia   Hx of dysplastic nevus 06/21/2009   Left ant axilla/ slight atypia   Hypercholesterolemia 08/05/2019   Hypertension    IBS (irritable bowel syndrome)    Osteopenia    Plantar fasciitis    Prediabetes    Past Surgical History:  Procedure Laterality Date   BREAST BIOPSY Right 03/20/14   negative   COLONOSCOPY     COLONOSCOPY WITH PROPOFOL N/A 08/15/2020   Procedure: COLONOSCOPY WITH PROPOFOL;  Surgeon: Toledo, Benay Pike, MD;  Location: [facility] ENDOSCOPY;  Service: Gastroenterology;  Laterality: N/A;   dental implants     FRACTURE SURGERY  01/2007   nose fracture-repair   RHINOPLASTY  2008   For fractured nose   UPPER GI ENDOSCOPY     Family History  Problem  Relation Age of Onset   Colon cancer Mother    Hypertension Mother    Basal cell carcinoma Mother    Osteoporosis Mother    Diabetes Father    Hypertension Father    Heart disease Father    Alzheimer's disease Father    Breast cancer Neg Hx    Social History   Tobacco Use   Smoking status: Never   Smokeless tobacco: Never  Vaping Use   Vaping Use: Never used  Substance Use Topics   Alcohol use: No   Drug use: No    Current Outpatient Medications:    acetaminophen (TYLENOL) 325 MG tablet, Take 325 mg by mouth every 6 (six) hours as needed for mild pain., Disp: , Rfl:    Biotin 1000 MCG tablet, Take 1 tablet by mouth daily., Disp: , Rfl:    calcium citrate-vitamin D (CITRACAL+D) 315-200 MG-UNIT per tablet, Take 1 tablet by mouth 2 (two) times daily. Take 1 tablet in am, 2 tablet in pm, Disp: , Rfl:    calcium-vitamin D (OSCAL WITH D) 500-200 MG-UNIT tablet, Take 1 tablet by mouth., Disp: , Rfl:    cholecalciferol (VITAMIN D3) 10 MCG (400 UNIT) TABS tablet, Take 1,000 Units by mouth daily., Disp: , Rfl:    Cranberry 200 MG CAPS, Take 2 capsules  by mouth 2 (two) times daily. , Disp: , Rfl:    cyclobenzaprine (FLEXERIL) 5 MG tablet, Take 2.5 mg by mouth at bedtime as needed for muscle spasms., Disp: , Rfl:    diphenhydrAMINE (BENADRYL) 25 mg capsule, Take 25 mg by mouth every 6 (six) hours as needed., Disp: , Rfl:    etodolac (LODINE) 500 MG tablet, Take 500 mg by mouth 2 (two) times daily as needed. , Disp: , Rfl:    fexofenadine (ALLEGRA) 180 MG tablet, Take 180 mg by mouth daily as needed for allergies. , Disp: , Rfl:    ibandronate (BONIVA) 150 MG tablet, Take 1 tablet (150 mg total) by mouth every 30 (thirty) days., Disp: 1 tablet, Rfl: 11   ibuprofen (ADVIL) 200 MG tablet, Take 200 mg by mouth every 6 (six) hours as needed for moderate pain., Disp: , Rfl:    Lactobacillus Acidophilus POWD, Take 1 capsule by mouth 2 (two) times daily., Disp: , Rfl:    magnesium 30 MG tablet, Take  250 mg by mouth daily. , Disp: , Rfl:    Multiple Vitamin (MULTIVITAMIN) capsule, Take 1 capsule by mouth daily., Disp: , Rfl:    naproxen sodium (ALEVE) 220 MG tablet, Take 1 tablet by mouth 2 (two) times daily as needed (pain). , Disp: , Rfl:    neomycin-bacitracin-polymyxin (NEOSPORIN) ophthalmic ointment, 0.5 inches 4 (four) times daily as needed, Disp: , Rfl:    omeprazole (PRILOSEC) 20 MG capsule, Take 20 mg by mouth daily as needed., Disp: , Rfl:    phenylephrine-shark liver oil-mineral oil-petrolatum (PREPARATION H) 0.25-3-14-71.9 % rectal ointment, Place 1 application rectally 2 (two) times daily as needed for hemorrhoids., Disp: , Rfl:    ALPRAZolam (XANAX) 0.25 MG tablet, Take 1 tablet (0.25 mg total) by mouth as needed., Disp: 30 tablet, Rfl: 2   ESTRACE VAGINAL 0.1 MG/GM vaginal cream, Apply small amount 3 times weekly, Disp: 42.5 g, Rfl: 2   metoprolol succinate (TOPROL-XL) 50 MG 24 hr tablet, Take by mouth daily. , Disp: , Rfl:  Allergies: Prednisone and Sulfa antibiotics  Review of Systems  Constitutional:  Negative for chills, fever and malaise/fatigue.  HENT:  Negative for congestion, sinus pain and sore throat.   Eyes:  Negative for blurred vision and pain.  Respiratory:  Negative for cough and wheezing.   Cardiovascular:  Negative for chest pain and leg swelling.  Gastrointestinal:  Negative for abdominal pain, constipation, diarrhea, heartburn, nausea and vomiting.  Genitourinary:  Negative for dysuria, frequency, hematuria and urgency.  Musculoskeletal:  Negative for back pain, joint pain, myalgias and neck pain.  Skin:  Negative for itching and rash.  Neurological:  Negative for dizziness, tremors and weakness.  Endo/Heme/Allergies:  Does not bruise/bleed easily.  Psychiatric/Behavioral:  Negative for depression. The patient is not nervous/anxious and does not have insomnia.    Objective: BP 120/80   Ht '5\' 1"'$  (1.549 m)   Wt 119 lb (54 kg)   BMI 22.48 kg/m   Filed  Weights   05/06/21 1520  Weight: 119 lb (54 kg)   Body mass index is 22.48 kg/m. Physical Exam Constitutional:      General: She is not in acute distress.    Appearance: She is well-developed.  Genitourinary:     Bladder, rectum and urethral meatus normal.     No lesions in the vagina.     Right Labia: No rash, tenderness or lesions.    Left Labia: No tenderness, lesions or rash.  No vaginal bleeding.      Right Adnexa: not tender and no mass present.    Left Adnexa: not tender and no mass present.    No cervical motion tenderness, friability, lesion or polyp.     Uterus is not enlarged.     No uterine mass detected.    Pelvic exam was performed with patient in the lithotomy position.  Breasts:    Right: No mass, skin change or tenderness.     Left: No mass, skin change or tenderness.  HENT:     Head: Normocephalic and atraumatic. No laceration.     Right Ear: Hearing normal.     Left Ear: Hearing normal.     Mouth/Throat:     Pharynx: Uvula midline.  Eyes:     Pupils: Pupils are equal, round, and reactive to light.  Neck:     Thyroid: No thyromegaly.  Cardiovascular:     Rate and Rhythm: Normal rate and regular rhythm.     Heart sounds: No murmur heard.   No friction rub. No gallop.  Pulmonary:     Effort: Pulmonary effort is normal. No respiratory distress.     Breath sounds: Normal breath sounds. No wheezing.  Abdominal:     General: Bowel sounds are normal. There is no distension.     Palpations: Abdomen is soft.     Tenderness: There is no abdominal tenderness. There is no rebound.  Musculoskeletal:        General: Normal range of motion.     Cervical back: Normal range of motion and neck supple.  Neurological:     Mental Status: She is alert and oriented to person, place, and time.     Cranial Nerves: No cranial nerve deficit.  Skin:    General: Skin is warm and dry.  Psychiatric:        Judgment: Judgment normal.  Vitals reviewed.    Assessment:  Annual Exam 1. Women's annual routine gynecological examination   2. Encounter for screening mammogram for malignant neoplasm of breast   3. Screening for cervical cancer   4. Vulvar lesion   5. Anxiety     Plan:            1.  Cervical Screening-  Pap smear done today  2. Breast screening- Exam annually and mammogram scheduled  3. Colonoscopy every 5 years, Hemoccult testing after age 72  4. Labs managed by PCP  5. Counseling for hormonal therapy: no change in therapy today Cont vag ERT 3 times weekly Also uses dilator therapy (monthly, rare use) for vaginismus    F/U  Return in about 1 year (around 05/06/2022) for Annual.  Barnett Applebaum, MD, [facility], [facility] 05/06/2021  3:54 PM

## 2021-05-10 LAB — CYTOLOGY - PAP
Comment: NEGATIVE
Diagnosis: NEGATIVE
Diagnosis: REACTIVE
High risk HPV: NEGATIVE

## 2021-06-04 ENCOUNTER — Ambulatory Visit
Admission: RE | Admit: 2021-06-04 | Discharge: 2021-06-04 | Disposition: A | Discharge status: Home / Self Care | Payer: Medicare PPO | Source: Ambulatory Visit | Attending: Obstetrics & Gynecology | Admitting: Obstetrics & Gynecology

## 2021-06-04 ENCOUNTER — Other Ambulatory Visit: Payer: Self-pay

## 2021-06-04 DIAGNOSIS — Z1231 Encounter for screening mammogram for malignant neoplasm of breast: Secondary | ICD-10-CM | POA: Insufficient documentation

## 2021-11-25 ENCOUNTER — Ambulatory Visit: Payer: Medicare PPO | Admitting: Dermatology

## 2021-11-25 ENCOUNTER — Other Ambulatory Visit: Payer: Self-pay

## 2021-11-25 DIAGNOSIS — Z1283 Encounter for screening for malignant neoplasm of skin: Secondary | ICD-10-CM | POA: Diagnosis not present

## 2021-11-25 DIAGNOSIS — L820 Inflamed seborrheic keratosis: Secondary | ICD-10-CM | POA: Diagnosis not present

## 2021-11-25 DIAGNOSIS — L814 Other melanin hyperpigmentation: Secondary | ICD-10-CM

## 2021-11-25 DIAGNOSIS — L720 Epidermal cyst: Secondary | ICD-10-CM

## 2021-11-25 DIAGNOSIS — Z85828 Personal history of other malignant neoplasm of skin: Secondary | ICD-10-CM

## 2021-11-25 DIAGNOSIS — D229 Melanocytic nevi, unspecified: Secondary | ICD-10-CM

## 2021-11-25 DIAGNOSIS — Q825 Congenital non-neoplastic nevus: Secondary | ICD-10-CM

## 2021-11-25 DIAGNOSIS — L578 Other skin changes due to chronic exposure to nonionizing radiation: Secondary | ICD-10-CM | POA: Diagnosis not present

## 2021-11-25 DIAGNOSIS — L821 Other seborrheic keratosis: Secondary | ICD-10-CM

## 2021-11-25 DIAGNOSIS — R202 Paresthesia of skin: Secondary | ICD-10-CM

## 2021-11-25 DIAGNOSIS — Z86018 Personal history of other benign neoplasm: Secondary | ICD-10-CM

## 2021-11-25 DIAGNOSIS — D1800 Hemangioma unspecified site: Secondary | ICD-10-CM

## 2021-11-25 NOTE — Patient Instructions (Addendum)
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 This is usually accompanied by chronic rubbing or scratching. There is no cure, but there are some treatments which may help control the itch.   Over the counter (non-prescription) treatments for notalgia paresthetica include numbing creams like pramoxine or lidocaine which temporarily reduce itch or Capsaicin-containing creams which cause a burning sensation but which sometimes over time will reset the nerves to stop producing itch.   If you choose to use Capsaicin cream, it is recommended to use it 5 times daily for 1 week followed by 3 times daily for 3-6 weeks. You may have to continue using it long-term. For severe cases, there are some prescription cream or pill options which may help.  -------------------------------------------------------------------------------------------------------------------------------------------   If You Need Anything After Your Visit  If you have any questions or concerns for your doctor, please call our main line at [phone] and press option 4 to reach your doctor's medical assistant. If no one answers, please leave a voicemail as directed and we will return your call as soon as possible. Messages left after 4 pm will be answered the following business day.   You may also send [place] a message via [place]. We typically respond to MyChart messages within 1-2 business days.  For prescription refills, please ask your pharmacy to contact our office. Our fax number is [phone].  If you have an urgent issue when the clinic is closed that cannot wait until the next business day, you can page your doctor at the number below.    Please note that while  we do our best to be available for urgent issues outside of office hours, we are not available 24/7.   If you have an urgent issue and are unable to reach [place], you may choose to seek medical care at your doctor's office, retail clinic, urgent care center, or emergency room.  If you have a medical emergency, please immediately call 911 or go to the emergency department.  Pager Numbers  - Dr. Nehemiah Massed: [phone]  - Dr. Laurence Ferrari: [phone]  - Dr. Nicole Kindred: [phone]  In the event of inclement weather, please call our main line at [phone] for an update on the status of any delays or closures.  Dermatology Medication Tips: Please keep the boxes that topical medications come in in order to help keep track of the instructions about where and how to use these. Pharmacies typically print the medication instructions only on the boxes and not directly on the medication tubes.   If your medication is too expensive, please contact our office at [phone] option 4 or send [place] a message through [place].   We are unable to tell what your co-pay for medications will be in advance as this is different depending on your insurance coverage. However, we may be able to find a substitute medication at lower cost or fill out paperwork to get insurance to cover a needed medication.   If a prior authorization is required to get your medication covered by your insurance company, please allow [place] 1-2 business days to complete this process.  Drug prices often vary depending on where the prescription is filled and some pharmacies may offer cheaper prices.  The website www.goodrx.com contains coupons for medications through different pharmacies. The prices here do not account for what the cost may be with help from insurance (  it may be cheaper with your insurance), but the website can give you the price if you did not use any insurance.  - You can print the associated coupon and take it with your prescription  to the pharmacy.  - You may also stop by our office during regular business hours and pick up a GoodRx coupon card.  - If you need your prescription sent electronically to a different pharmacy, notify our office through [facility] or by phone at [phone] option 4.     Si Usted Necesita Algo Despus de Su Visita  Tambin puede enviarnos un mensaje a travs de Pharmacist, community. Por lo general respondemos a los mensajes de MyChart en el transcurso de 1 a 2 das hbiles.  Para renovar recetas, por favor pida a su farmacia que se ponga en contacto con nuestra oficina. Harland Dingwall de fax es [place] [phone].  Si tiene un asunto urgente cuando la clnica est cerrada y que no puede esperar hasta el siguiente da hbil, puede llamar/localizar a su doctor(a) al nmero que aparece a continuacin.   Por favor, tenga en cuenta que aunque hacemos todo lo posible para estar disponibles para asuntos urgentes fuera del horario de [place], no estamos disponibles las 24 horas del da, los 7 das de la [place].   Si tiene un problema urgente y no puede comunicarse con nosotros, puede optar por buscar atencin mdica  en el consultorio de su doctor(a), en una clnica privada, en un centro de atencin urgente o en una sala de emergencias.  Si tiene Engineering geologist, por favor llame inmediatamente al 911 o vaya a la sala de emergencias.  Nmeros de bper  - Dr. Nehemiah Massed: [phone]  - Dra. Moye: [phone]  - Dra. Nicole Kindred: [phone]  En caso de inclemencias del [place], por favor llame a Johnsie Kindred principal al [phone] para una actualizacin sobre el [place] de cualquier retraso o cierre.  Consejos para la medicacin en dermatologa: Por favor, guarde las cajas en las que vienen los medicamentos de uso tpico para ayudarle a seguir las instrucciones sobre dnde y cmo usarlos. Las farmacias generalmente imprimen las instrucciones del medicamento slo en las cajas y no directamente en  los tubos del [place].   Si su medicamento es muy caro, por favor, pngase en contacto con Zigmund Daniel llamando al [phone] y presione la opcin 4 o envenos un mensaje a travs de Pharmacist, community.   No podemos decirle cul ser su copago por los medicamentos por adelantado ya que esto es diferente dependiendo de la cobertura de su seguro. Sin embargo, es posible que podamos encontrar un medicamento sustituto a Electrical engineer un formulario para que el seguro cubra el medicamento que se considera necesario.   Si se requiere una autorizacin previa para que su compaa de seguros [place] su medicamento, por favor permtanos de 1 a 2 das hbiles para completar este proceso.  Los precios de los medicamentos varan con frecuencia dependiendo del Environmental consultant de dnde se surte la receta y alguna farmacias pueden ofrecer precios ms baratos.  El sitio web www.goodrx.com tiene cupones para medicamentos de Airline pilot. Los precios aqu no tienen en cuenta lo que podra costar con la ayuda del seguro (puede ser ms barato con su seguro), pero el sitio web puede darle el precio si no utiliz Research scientist (physical sciences).  - Puede imprimir el cupn correspondiente y llevarlo con su receta a la farmacia.  - Tambin puede pasar por nuestra oficina durante el horario de atencin  regular y recoger una tarjeta de cupones de GoodRx.  - Si necesita que su receta se enve electrnicamente a una farmacia diferente, informe a nuestra oficina a travs de MyChart de [place] o por telfono llamando al [phone] y presione la opcin 4.

## 2021-11-25 NOTE — Progress Notes (Signed)
Follow-Up Visit   Subjective  Tiffany [name] is a 71 y.o. female who presents for the following: Annual Exam (Hx of BCC and dysplastic nevi ). The patient presents for Total-Body Skin Exam (TBSE) for skin cancer screening and mole check.  The patient has spots, moles and lesions to be evaluated, some may be new or changing.  Complains of itch of back.  Rough irritating growths face and trunk.  The following portions of the chart were reviewed this encounter and updated as appropriate:   Tobacco   Allergies   Meds   Problems   Med Hx   Surg Hx   Fam Hx      Review of Systems:  No other skin or systemic complaints except as noted in HPI or Assessment and Plan.  Objective  Well appearing patient in no apparent distress; mood and affect are within normal limits.  A full examination was performed including scalp, head, eyes, ears, nose, lips, neck, chest, axillae, abdomen, back, buttocks, bilateral upper extremities, bilateral lower extremities, hands, feet, fingers, toes, fingernails, and toenails. All findings within normal limits unless otherwise noted below.  L face x 4, R med infraclavicular x 2, inframammary x 5 (11) Erythematous stuck-on, waxy papule or plaque  Back Clear today.   L ant thigh Brown macule.    Assessment & Plan  Inflamed seborrheic keratosis (11) L face x 4, R med infraclavicular x 2, inframammary x 5  Destruction of lesion - L face x 4, R med infraclavicular x 2, inframammary x 5 Complexity: simple   Destruction method: cryotherapy   Informed consent: discussed and consent obtained   Timeout:  patient name, date of birth, surgical site, and procedure verified Lesion destroyed using liquid nitrogen: Yes   Region frozen until ice ball extended beyond lesion: Yes   Outcome: patient tolerated procedure well with no complications   Post-procedure details: wound care instructions given    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 This is usually accompanied by chronic rubbing or scratching. There is no cure, but there are some treatments which may help control the itch.   Over the counter (non-prescription) treatments for notalgia paresthetica include numbing creams like pramoxine or lidocaine which temporarily reduce itch or Capsaicin-containing creams which cause a burning sensation but which sometimes over time will reset the nerves to stop producing itch.   If you choose to use Capsaicin cream, it is recommended to use it 5 times daily for 1 week followed by 3 times daily for 3-6 weeks. You may have to continue using it long-term. For severe cases, there are some prescription cream or pill options which may help.  Congenital nevus L ant thigh Benign-appearing.  Observation.  Call clinic for new or changing lesions.  Recommend daily use of broad spectrum spf 30+ sunscreen to sun-exposed areas.   Skin cancer screening  Lentigines - Scattered tan macules - Due to sun exposure - Benign-appearing, observe - Recommend daily broad spectrum sunscreen SPF 30+ to sun-exposed areas, reapply every 2 hours as needed. - Call for any changes  Seborrheic Keratoses - Stuck-on, waxy, tan-brown papules and/or plaques  - Benign-appearing - Discussed benign etiology and prognosis. - Observe - Call for any changes  Melanocytic Nevi - Tan-brown and/or pink-flesh-colored symmetric macules and papules - Benign appearing on exam today -  Observation - Call clinic for new or changing moles - Recommend daily use of broad spectrum spf 30+ sunscreen to sun-exposed areas.   Hemangiomas - Red papules - Discussed benign nature - Observe - Call for any changes  Actinic Damage - Chronic condition, secondary to cumulative UV/sun exposure - diffuse scaly erythematous macules with underlying  dyspigmentation - Recommend daily broad spectrum sunscreen SPF 30+ to sun-exposed areas, reapply every 2 hours as needed.  - Staying in the shade or wearing long sleeves, sun glasses (UVA+UVB protection) and wide brim hats (4-inch brim around the entire circumference of the hat) are also recommended for sun protection.  - Call for new or changing lesions.  History of Basal Cell Carcinoma of the Skin - No evidence of recurrence today - Recommend regular full body skin exams - Recommend daily broad spectrum sunscreen SPF 30+ to sun-exposed areas, reapply every 2 hours as needed.  - Call if any new or changing lesions are noted between office visits  History of Dysplastic Nevi - No evidence of recurrence today - Recommend regular full body skin exams - Recommend daily broad spectrum sunscreen SPF 30+ to sun-exposed areas, reapply every 2 hours as needed.  - Call if any new or changing lesions are noted between office visits  Milia - tiny firm white papules - type of cyst - benign - may be extracted if symptomatic - observe  Skin cancer screening performed today.  Return in about 1 year (around 11/25/2022) for TBSE.  Luther Redo, CMA, am acting as scribe for Sarina Ser, MD . Documentation: I have reviewed the above documentation for accuracy and completeness, and I agree with the above.  Sarina Ser, MD

## 2021-11-27 ENCOUNTER — Encounter: Payer: Self-pay | Admitting: Dermatology

## 2021-12-02 ENCOUNTER — Telehealth: Payer: Self-pay

## 2021-12-02 NOTE — Telephone Encounter (Signed)
Patient called requesting RFs of Rosacea-Oxymetazoline on [facility]. RX sent in and patient advised.  ?

## 2022-01-16 ENCOUNTER — Other Ambulatory Visit: Payer: Self-pay | Admitting: Internal Medicine

## 2022-01-16 DIAGNOSIS — Z780 Asymptomatic menopausal state: Secondary | ICD-10-CM

## 2022-01-16 DIAGNOSIS — M810 Age-related osteoporosis without current pathological fracture: Secondary | ICD-10-CM

## 2022-01-16 DIAGNOSIS — Z1231 Encounter for screening mammogram for malignant neoplasm of breast: Secondary | ICD-10-CM

## 2022-02-10 ENCOUNTER — Encounter: Payer: Self-pay | Admitting: Ophthalmology

## 2022-02-12 NOTE — Discharge Instructions (Signed)

## 2022-02-14 ENCOUNTER — Encounter: Payer: Self-pay | Admitting: Ophthalmology

## 2022-02-14 ENCOUNTER — Other Ambulatory Visit: Payer: Self-pay

## 2022-02-14 ENCOUNTER — Encounter
Admission: RE | Disposition: A | Discharge status: Home / Self Care | Payer: Self-pay | Source: Home / Self Care | Attending: Ophthalmology

## 2022-02-14 ENCOUNTER — Ambulatory Visit: Payer: Medicare PPO | Admitting: Anesthesiology

## 2022-02-14 ENCOUNTER — Ambulatory Visit
Admission: RE | Admit: 2022-02-14 | Discharge: 2022-02-14 | Disposition: A | Discharge status: Home / Self Care | Payer: Medicare PPO | Attending: Ophthalmology | Admitting: Ophthalmology

## 2022-02-14 DIAGNOSIS — H02403 Unspecified ptosis of bilateral eyelids: Secondary | ICD-10-CM | POA: Diagnosis not present

## 2022-02-14 DIAGNOSIS — H02834 Dermatochalasis of left upper eyelid: Secondary | ICD-10-CM | POA: Insufficient documentation

## 2022-02-14 DIAGNOSIS — I10 Essential (primary) hypertension: Secondary | ICD-10-CM | POA: Diagnosis not present

## 2022-02-14 DIAGNOSIS — F419 Anxiety disorder, unspecified: Secondary | ICD-10-CM | POA: Insufficient documentation

## 2022-02-14 DIAGNOSIS — H02831 Dermatochalasis of right upper eyelid: Secondary | ICD-10-CM | POA: Insufficient documentation

## 2022-02-14 DIAGNOSIS — K219 Gastro-esophageal reflux disease without esophagitis: Secondary | ICD-10-CM | POA: Diagnosis not present

## 2022-02-14 DIAGNOSIS — H0289 Other specified disorders of eyelid: Secondary | ICD-10-CM | POA: Insufficient documentation

## 2022-02-14 HISTORY — PX: BROW LIFT: SHX178

## 2022-02-14 HISTORY — DX: Reserved for inherently not codable concepts without codable children: IMO0001

## 2022-02-14 HISTORY — DX: Presence of spectacles and contact lenses: Z97.3

## 2022-02-14 HISTORY — DX: Encounter for fitting and adjustment of orthodontic device: Z46.4

## 2022-02-14 SURGERY — BLEPHAROPLASTY
Anesthesia: Monitor Anesthesia Care | Site: Eye | Laterality: Bilateral

## 2022-02-14 MED ORDER — TETRACAINE HCL 0.5 % OP SOLN
OPHTHALMIC | Status: DC | PRN
  Administered 2022-02-14: 2 [drp] via OPHTHALMIC

## 2022-02-14 MED ORDER — BSS IO SOLN
INTRAOCULAR | Status: DC | PRN
  Administered 2022-02-14: 15 mL via INTRAOCULAR

## 2022-02-14 MED ORDER — FENTANYL CITRATE (PF) 100 MCG/2ML IJ SOLN
INTRAMUSCULAR | Status: DC | PRN
  Administered 2022-02-14: 25 ug via INTRAVENOUS
  Administered 2022-02-14: 50 ug via INTRAVENOUS
  Administered 2022-02-14: 25 ug via INTRAVENOUS

## 2022-02-14 MED ORDER — MIDAZOLAM HCL 2 MG/2ML IJ SOLN
INTRAMUSCULAR | Status: DC | PRN
  Administered 2022-02-14 (×2): 1 mg via INTRAVENOUS

## 2022-02-14 MED ORDER — ACETAMINOPHEN 325 MG PO TABS: 325.0000 mg | ORAL_TABLET | Freq: Once | ORAL | Status: DC

## 2022-02-14 MED ORDER — ACETAMINOPHEN 160 MG/5ML PO SOLN: 325.0000 mg | Freq: Once | ORAL | Status: DC

## 2022-02-14 MED ORDER — ERYTHROMYCIN 5 MG/GM OP OINT: TOPICAL_OINTMENT | OPHTHALMIC | 2 refills | Status: DC

## 2022-02-14 MED ORDER — ERYTHROMYCIN 5 MG/GM OP OINT
TOPICAL_OINTMENT | OPHTHALMIC | Status: DC | PRN
  Administered 2022-02-14: 1 via OPHTHALMIC

## 2022-02-14 MED ORDER — LIDOCAINE-EPINEPHRINE 2 %-1:100000 IJ SOLN
INTRAMUSCULAR | Status: DC | PRN
  Administered 2022-02-14: 5 mL via OPHTHALMIC

## 2022-02-14 MED ORDER — TRAMADOL HCL 50 MG PO TABS: ORAL_TABLET | ORAL | 0 refills | Status: DC

## 2022-02-14 MED ORDER — LACTATED RINGERS IV SOLN: INTRAVENOUS | Status: DC

## 2022-02-14 SURGICAL SUPPLY — 37 items
APPLICATOR COTTON TIP WD 3 STR (MISCELLANEOUS) ×3 IMPLANT
BLADE SURG 15 STRL LF DISP TIS (BLADE) ×1 IMPLANT
BLADE SURG 15 STRL SS (BLADE) ×2
CORD BIP STRL DISP 12FT (MISCELLANEOUS) ×2 IMPLANT
DRSG TELFA 4X3 1S NADH ST (GAUZE/BANDAGES/DRESSINGS) ×1 IMPLANT
GAUZE SPONGE 4X4 12PLY STRL (GAUZE/BANDAGES/DRESSINGS) ×2 IMPLANT
GLOVE SURG UNDER POLY LF SZ7 (GLOVE) ×4 IMPLANT
GOWN STRL REUS W/ TWL LRG LVL3 (GOWN DISPOSABLE) ×1 IMPLANT
GOWN STRL REUS W/TWL LRG LVL3 (GOWN DISPOSABLE) ×2
MARKER SKIN XFINE TIP W/RULER (MISCELLANEOUS) ×2 IMPLANT
NDL FILTER BLUNT 18X1 1/2 (NEEDLE) ×1 IMPLANT
NDL HYPO 30X.5 LL (NEEDLE) ×2 IMPLANT
NEEDLE FILTER BLUNT 18X 1/2SAF (NEEDLE) ×1
NEEDLE FILTER BLUNT 18X1 1/2 (NEEDLE) ×1 IMPLANT
NEEDLE HYPO 30X.5 LL (NEEDLE) ×4 IMPLANT
PACK ENT CUSTOM (PACKS) ×2 IMPLANT
SOL PREP PVP 2OZ (MISCELLANEOUS) ×2
SOLUTION PREP PVP 2OZ (MISCELLANEOUS) ×1 IMPLANT
SPONGE GAUZE 2X2 8PLY STRL LF (GAUZE/BANDAGES/DRESSINGS) ×20 IMPLANT
SUT CHROMIC 4-0 (SUTURE)
SUT CHROMIC 4-0 M2 12X2 ARM (SUTURE)
SUT CHROMIC 5 0 P 3 (SUTURE) IMPLANT
SUT ETHILON 4 0 CL P 3 (SUTURE) IMPLANT
SUT GUT PLAIN 6-0 1X18 ABS (SUTURE) ×2 IMPLANT
SUT MERSILENE 4-0 S-2 (SUTURE) IMPLANT
SUT PROLENE 5 0 P 3 (SUTURE) IMPLANT
SUT PROLENE 6 0 P 1 18 (SUTURE) ×2 IMPLANT
SUT SILK 4 0 G 3 (SUTURE) IMPLANT
SUT VIC AB 5-0 P-3 18X BRD (SUTURE) IMPLANT
SUT VIC AB 5-0 P3 18 (SUTURE)
SUT VICRYL 6-0  S14 CTD (SUTURE)
SUT VICRYL 6-0 S14 CTD (SUTURE) IMPLANT
SUT VICRYL 7 0 TG140 8 (SUTURE) IMPLANT
SUTURE CHRMC 4-0 M2 12X2 ARM (SUTURE) IMPLANT
SYR 10ML LL (SYRINGE) ×2 IMPLANT
SYR 3ML LL SCALE MARK (SYRINGE) ×2 IMPLANT
WATER STERILE IRR 250ML POUR (IV SOLUTION) ×2 IMPLANT

## 2022-02-14 NOTE — Op Note (Signed)
Preoperative Diagnosis:  1. Visually significant blepharoptosis both  Upper Eyelid(s) 2. Visually significant dermatochalasis both  Upper Eyelid(s) 3.  Right lower eyelid lesion suspicious for chalazion  Postoperative Diagnosis:  Same.  Procedure(s) Performed:   1. Blepharoptosis repair with levator aponeurosis advancement bilateral  Upper Eyelid(s) 2. Upper eyelid blepharoplasty with excess skin excision  bilateral  Upper Eyelid(s) 3.  Shave biopsy right lower eyelid margin  Surgeon: Philis Pique. Vickki Muff, M.D.  Assistants: none  Anesthesia: MAC  Specimens: 1 mm right lower eyelid tissue  Estimated Blood Loss: Minimal.  Complications: None.  Operative Findings: None Dictated  Procedure:   Allergies were reviewed and the patient Prednisone and Sulfa antibiotics.   After the risks, benefits, complications and alternatives were discussed with the patient, appropriate informed consent was obtained.  While seated in an upright position and looking in primary gaze, the mid pupillary line was marked on the upper eyelid margins bilaterally. The patient was then brought to the operating suite and reclined supine.  Timeout was conducted and the patient was sedated.  Local anesthetic consisting of a 50-50 mixture of 2% lidocaine with epinephrine and 0.75% bupivacaine with added Hylenex was injected subcutaneously to both  upper eyelid(s). After adequate local was instilled, the patient was prepped and draped in the usual sterile fashion for eyelid surgery.   Attention was turned to the upper eyelids. A 55m upper eyelid crease incision line was marked with calipers on both  upper eyelid(s).  A pinch test was used to estimate the amount of excess skin to remove and this was marked in standard blepharoplasty style fashion. Attention was turned to the  right  upper eyelid. A #15 blade was used to open the premarked incision line. A Skin only flap was excised and hemostasis was obtained with bipolar  cautery.  A strip of roof fat was excised to debulk the lateral upper eyelid.  [place] scissors were then used to transect through orbicularis down to the tarsal plate. Epitarsus was dissected to create a smooth surface to suture to. Dissection was then carried superiorly in the plane between orbicularis and orbital septum. Once the preaponeurotic fat pocket was identified, the orbital septum was opened. This revealed the levator and its aponeurosis.    Attention was then turned to the opposite eyelid where the same procedure was performed in the same manner. Hemostasis was obtained with bipolar cautery throughout.   3 interrupted 6-0 Prolene sutures were then passed partial thickness through the tarsal plates of both  upper eyelid(s). These sutures were placed in line with the mid pupillary, medial limbal, and lateral limbal lines. The sutures were fixed to the levator aponeurosis and adjusted until a nice lid height and contour were achieved. Once nice symmetry was achieved, the skin incisions were closed with a running 6-0 plain gut suture.   Attention was turned to the right lower eyelid.  [place] scissors were used to shave the small piece of the eyelid margin in the affected area and this was handed off as specimen.  Hemostasis was with bipolar cautery.  The patient tolerated the procedure well.  Erythromycin ophthalmic ophthalmic ointment was applied to the incision site(s) followed by ice packs. The patient was taken to the recovery area where she recovered without difficulty.  Post-Op Plan/Instructions:   The patient was instructed to use ice packs frequently for the next 48 hours. She was instructed to use Erythromycin ophthalmic ophthalmic ointment on her incisions 4 times a day for the next 12 to  14 days. She was given a prescription for tramadol (or similar) for pain control should Tylenol not be effective. She was asked to to follow up at the [facility] in [place], [place] in 2-3  weeks' time or sooner as needed for problems.  Hayat Warbington M. Vickki Muff, M.D. Ophthalmology

## 2022-02-14 NOTE — Interval H&P Note (Signed)
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  The various methods of treatment have been discussed with the patient and family. After consideration of risks, benefits and other options for treatment, the patient has consented to  Procedure(s): BLEPHAROPLASTY UPPER EYELID; WITH EXCESS SKIN, BILATERAL BLEPHAROPTOSIS REPAIR, RESECT EX BILATEAL SHAVE BIOPSY RIGHT LOWER EYELID (Bilateral) as a surgical intervention.  The patient's history has been reviewed, patient examined, no change in status, stable for surgery.  I have reviewed the patient's chart and labs.  Questions were answered to the patient's satisfaction.     Vickki Muff, Gevorg Brum M

## 2022-02-14 NOTE — Anesthesia Procedure Notes (Signed)
Date/Time: 02/14/2022 11:07 AM Performed by: Cameron Ali, CRNA Pre-anesthesia Checklist: Patient identified, Emergency Drugs available, Suction available, Timeout performed and Patient being monitored Patient Re-evaluated:Patient Re-evaluated prior to induction Oxygen Delivery Method: Nasal cannula Placement Confirmation: positive ETCO2

## 2022-02-14 NOTE — Anesthesia Preprocedure Evaluation (Addendum)
Anesthesia Evaluation  Patient identified by MRN, date of birth, ID band Patient awake    Reviewed: Allergy & Precautions, H&P , NPO status , Patient's Chart, lab work & pertinent test results  Airway Mallampati: II  TM Distance: >3 FB Neck ROM: full    Dental no notable dental hx.    Pulmonary    Pulmonary exam normal breath sounds clear to auscultation       Cardiovascular hypertension, Normal cardiovascular exam Rhythm:regular Rate:Normal     Neuro/Psych Anxiety    GI/Hepatic GERD  ,  Endo/Other    Renal/GU      Musculoskeletal   Abdominal   Peds  Hematology   Anesthesia Other Findings   Reproductive/Obstetrics                            Anesthesia Physical Anesthesia Plan  ASA: 2  Anesthesia Plan: MAC   Post-op Pain Management: Minimal or no pain anticipated   Induction:   PONV Risk Score and Plan: 2 and Treatment may vary due to age or medical condition, Ondansetron, TIVA and Propofol infusion  Airway Management Planned:   Additional Equipment:   Intra-op Plan:   Post-operative Plan:   Informed Consent: I have reviewed the patients History and Physical, chart, labs and discussed the procedure including the risks, benefits and alternatives for the proposed anesthesia with the patient or authorized representative who has indicated his/her understanding and acceptance.     Dental Advisory Given  Plan Discussed with: CRNA  Anesthesia Plan Comments:         Anesthesia Quick Evaluation

## 2022-02-14 NOTE — Transfer of Care (Signed)
Immediate Anesthesia Transfer of Care Note  Patient: Tiffany [name]  Procedure(s) Performed: BLEPHAROPLASTY UPPER EYELID; WITH EXCESS SKIN, BILATERAL BLEPHAROPTOSIS REPAIR, RESECT EX BILATEAL SHAVE BIOPSY RIGHT LOWER EYELID (Bilateral: Eye)  Patient Location: PACU  Anesthesia Type: MAC  Level of Consciousness: awake, alert  and patient cooperative  Airway and Oxygen Therapy: Patient Spontanous Breathing and Patient connected to supplemental oxygen  Post-op Assessment: Post-op Vital signs reviewed, Patient's Cardiovascular Status Stable, Respiratory Function Stable, Patent Airway and No signs of Nausea or vomiting  Post-op Vital Signs: Reviewed and stable  Complications: No notable events documented.

## 2022-02-14 NOTE — H&P (Signed)
Tiffany [name]: [facility]  Primary Care Physician:  [facility]: Dr. Philis Pique. Vickki Muff, M.D.  Pre-Procedure History & Physical: HPI:  Tiffany [name] is a 71 y.o. female here for periocular surgery.   Past Medical History:  Diagnosis Date   Allergy    Anxiety    Basal cell carcinoma    Basal cell carcinoma (BCC) of right upper arm 04/27/2008   R deltoid    DDD (degenerative disc disease), lumbar 08/05/2018   History of dysplastic nevus 07/21/2019   R lower quad abd 77HM inf lat to umbilicus/moderate atypia    Hx of dysplastic nevus 06/21/2015   L distal lat deltoid/ mod atypia    Hx of dysplastic nevus 06/21/2015   R distal ant thigh/ severe atypa    Hx of dysplastic nevus 06/15/2014   left side above waist/ mild atypia   Hx of dysplastic nevus 06/21/2009   Left ant axilla/ slight atypia   Hypercholesterolemia 08/05/2019   Hypertension    IBS (irritable bowel syndrome)    Orthodontics    multiple dental implants   Osteopenia    Plantar fasciitis    Prediabetes    Wears contact lenses    Right eye    Past Surgical History:  Procedure Laterality Date   BREAST BIOPSY Right 03/20/14   negative   COLONOSCOPY     COLONOSCOPY WITH PROPOFOL N/A 08/15/2020   Procedure: COLONOSCOPY WITH PROPOFOL;  Surgeon: Toledo, Benay Pike, MD;  Location: [facility] ENDOSCOPY;  Service: Gastroenterology;  Laterality: N/A;   dental implants     FRACTURE SURGERY  01/2007   nose fracture-repair   RHINOPLASTY  2008   For fractured nose   UPPER GI ENDOSCOPY      Prior to Admission medications   Medication Sig Start Date End Date Taking? Authorizing Provider  acetaminophen (TYLENOL) 325 MG tablet Take 325 mg by mouth every 6 (six) hours as needed for mild pain.   Yes [provider]  ALPRAZolam (XANAX) 0.25 MG tablet Take 1 tablet (0.25 mg total) by mouth as needed. 05/06/21  Yes Gae Dry, MD  B Complex Vitamins (B COMPLEX 1 PO) Take by mouth daily.    Yes [provider]  Biotin 1000 MCG tablet Take 1 tablet by mouth daily.   Yes [provider]  Calcium-Magnesium-Vitamin D (CALCIUM 1200+D3 PO) Take by mouth.   Yes [provider]  cholecalciferol (VITAMIN D3) 10 MCG (400 UNIT) TABS tablet Take 1,000 Units by mouth daily.   Yes [provider]  Cranberry 200 MG CAPS Take 2 capsules by mouth 2 (two) times daily.    Yes [provider]  cyclobenzaprine (FLEXERIL) 5 MG tablet Take 2.5 mg by mouth at bedtime as needed for muscle spasms.   Yes [provider]  diphenhydrAMINE (BENADRYL) 25 mg capsule Take 25 mg by mouth every 6 (six) hours as needed.   Yes [provider]  ESTRACE VAGINAL 0.1 MG/GM vaginal cream Apply small amount 3 times weekly 05/06/21  Yes Gae Dry, MD  fexofenadine (ALLEGRA) 180 MG tablet Take 180 mg by mouth daily as needed for allergies.  04/21/19  Yes [provider]  ibuprofen (ADVIL) 200 MG tablet Take 200 mg by mouth every 6 (six) hours as needed for moderate pain.   Yes [provider]  Lactobacillus Acidophilus POWD Take 1 capsule by mouth 2 (two) times daily.   Yes [provider]  magnesium 30 MG tablet Take  250 mg by mouth daily.    Yes [provider]  metoprolol succinate (TOPROL-XL) 25 MG 24 hr tablet Take by mouth. 10/10/21 10/10/22 Yes [provider]  Multiple Vitamin (MULTIVITAMIN) capsule Take 1 capsule by mouth daily.   Yes [provider]  naproxen sodium (ALEVE) 220 MG tablet Take 1 tablet by mouth 2 (two) times daily as needed (pain).    Yes [provider]  neomycin-bacitracin-polymyxin (NEOSPORIN) ophthalmic ointment 0.5 inches 4 (four) times daily as needed   Yes [provider]  NONFORMULARY OR COMPOUNDED ITEM Sm 4 Azeliac Acid 15%, Metronidazole 1%, Ivermectin 1% for rosacea   Yes [provider]  omeprazole (PRILOSEC) 20 MG capsule Take 20 mg by mouth daily  as needed.   Yes [provider]  phenylephrine-shark liver oil-mineral oil-petrolatum (PREPARATION H) 0.25-3-14-71.9 % rectal ointment Place 1 application rectally 2 (two) times daily as needed for hemorrhoids.   Yes [provider]  pseudoephedrine (SUDAFED) 30 MG tablet Take 30 mg by mouth every 4 (four) hours as needed for congestion.   Yes [provider]  triamcinolone (NASACORT) 55 MCG/ACT AERO nasal inhaler Place 2 sprays into the nose daily.   Yes [provider]    Allergies as of 12/13/2021 - Review Complete 11/27/2021  Allergen Reaction Noted   Prednisone Other (See Comments) 03/28/2015   Sulfa antibiotics Nausea Only 03/28/2015    Family History  Problem Relation Age of Onset   Colon cancer Mother    Hypertension Mother    Basal cell carcinoma Mother    Osteoporosis Mother    Diabetes Father    Hypertension Father    Heart disease Father    Alzheimer's disease Father    Breast cancer Neg Hx     Social History   Socioeconomic History   Marital status: Single    Spouse name: Not on file   Number of children: Not on file   Years of education: Not on file   Highest education level: Not on file  Occupational History   Not on file  Tobacco Use   Smoking status: Never   Smokeless tobacco: Never  Vaping Use   Vaping Use: Never used  Substance and Sexual Activity   Alcohol use: No   Drug use: No   Sexual activity: Never  Other Topics Concern   Not on file  Social History Narrative   Not on file   Social Determinants of Health   Financial Resource Strain: Not on file  Food Insecurity: Not on file  Transportation Needs: Not on file  Physical Activity: Not on file  Stress: Not on file  Social Connections: Not on file  Intimate Partner Violence: Not on file    Review of Systems: See HPI, otherwise negative ROS  Physical Exam: BP (!) 173/76   Pulse 63   Temp (!) 97.5 F (36.4 C) (Temporal)   Resp 18   Ht '5\' 1"'$   (1.549 m)   Wt 53.5 kg   SpO2 100%   BMI 22.30 kg/m  General:   Alert and cooperative in NAD Head:  Normocephalic and atraumatic. Respiratory:  Normal work of breathing.  Impression/Plan: Tiffany [name] is here for periocular surgery.  Risks, benefits, limitations, and alternatives regarding surgery have been reviewed with the patient.  Questions have been answered.  All parties agreeable.   Karle Starch, MD  02/14/2022, 10:36 AM

## 2022-02-14 NOTE — Anesthesia Postprocedure Evaluation (Signed)
Anesthesia Post Note  Patient: Tiffany [name]  Procedure(s) Performed: BLEPHAROPLASTY UPPER EYELID; WITH EXCESS SKIN, BILATERAL BLEPHAROPTOSIS REPAIR, RESECT EX BILATEAL SHAVE BIOPSY RIGHT LOWER EYELID (Bilateral: Eye)     Patient location during evaluation: PACU Anesthesia Type: MAC Level of consciousness: awake and alert and oriented Pain management: satisfactory to patient Vital Signs Assessment: post-procedure vital signs reviewed and stable Respiratory status: spontaneous breathing, nonlabored ventilation and respiratory function stable Cardiovascular status: blood pressure returned to baseline and stable Postop Assessment: Adequate PO intake and No signs of nausea or vomiting Anesthetic complications: no   No notable events documented.  Raliegh Ip

## 2022-02-17 ENCOUNTER — Encounter: Payer: Self-pay | Admitting: Ophthalmology

## 2022-03-20 LAB — ANATOMIC PATHOLOGY REPORT

## 2022-05-19 ENCOUNTER — Encounter: Payer: Self-pay | Admitting: Obstetrics & Gynecology

## 2022-05-19 ENCOUNTER — Ambulatory Visit (INDEPENDENT_AMBULATORY_CARE_PROVIDER_SITE_OTHER): Payer: Medicare PPO | Admitting: Obstetrics & Gynecology

## 2022-05-19 VITALS — BP 122/70 | Ht 61.0 in | Wt 120.0 lb

## 2022-05-19 DIAGNOSIS — Z0000 Encounter for general adult medical examination without abnormal findings: Secondary | ICD-10-CM | POA: Diagnosis not present

## 2022-05-19 DIAGNOSIS — Z01419 Encounter for gynecological examination (general) (routine) without abnormal findings: Secondary | ICD-10-CM

## 2022-05-19 DIAGNOSIS — N9089 Other specified noninflammatory disorders of vulva and perineum: Secondary | ICD-10-CM | POA: Diagnosis not present

## 2022-05-19 DIAGNOSIS — Z1231 Encounter for screening mammogram for malignant neoplasm of breast: Secondary | ICD-10-CM | POA: Diagnosis not present

## 2022-05-19 DIAGNOSIS — N952 Postmenopausal atrophic vaginitis: Secondary | ICD-10-CM | POA: Diagnosis not present

## 2022-05-19 MED ORDER — ESTRACE 0.1 MG/GM VA CREA: TOPICAL_CREAM | VAGINAL | 2 refills | Status: DC

## 2022-05-19 NOTE — Progress Notes (Signed)
Subjective:    Tiffany [name] is a 71 y.o. single G0 who presents for an annual exam. The patient has no complaints today. She needs a refill of her vaginal estrogen which she only uses on her vulva. She is requesting a refill of xanax which she has been using for years. I have explained that I generally don't prescribe benzos and that she should discuss this with her primary care provider. The patient is not currently sexually active since 2020. GYN screening history: last pap: was normal. The patient wears seatbelts: yes. The patient participates in regular exercise: yes. (Walks daily and goes to the gym 3 times per week). Has the patient ever been transfused or tattooed?: no. The patient reports that there is not domestic violence in her life.   Menstrual History: OB History     Gravida  0   Para  0   Term  0   Preterm  0   AB  0   Living  0      SAB  0   IAB  0   Ectopic  0   Multiple  0   Live Births  0            No LMP recorded. Patient is postmenopausal. Menopausal since 71 years old, denies PMB.    The following portions of the patient's history were reviewed and updated as appropriate: allergies, current medications, past family history, past medical history, past social history, past surgical history, and problem list.  Review of Systems Pertinent items are noted in HPI.  Her mom had colon cancer and her colonoscopy is up to date and every 5 years. She is a retired [employer] officer. She is going on a [place] cruise in the near future.   Objective:    BP 122/70   Ht '5\' 1"'$  (1.549 m)   Wt 120 lb (54.4 kg)   BMI 22.67 kg/m   General Appearance:    Alert, cooperative, no distress, appears stated age  Head:    Normocephalic, without obvious abnormality, atraumatic  Eyes:    PERRL, conjunctiva/corneas clear, EOM's intact, fundi    benign, both eyes  Ears:    Normal TM's and external ear canals, both ears  Nose:   Nares normal, septum midline,  mucosa normal, no drainage    or sinus tenderness  Throat:   Lips, mucosa, and tongue normal; teeth and gums normal  Neck:   Supple, symmetrical, trachea midline, no adenopathy;    thyroid:  no enlargement/tenderness/nodules; no carotid   bruit or JVD  Back:     Symmetric, no curvature, ROM normal, no CVA tenderness  Lungs:     Clear to auscultation bilaterally, respirations unlabored  Chest Wall:    No tenderness or deformity   Heart:    Regular rate and rhythm, S1 and S2 normal, no murmur, rub   or gallop  Breast Exam:    No tenderness, masses, or nipple abnormality  Abdomen:     Soft, non-tender, bowel sounds active all four quadrants,    no masses, no organomegaly  Genitalia:    Normal female without lesion, discharge or tenderness, marked vulvovaginal atrophy, normal vaginal mucosa noted. Bimanual exam reveals a tiny retroverted uterus and non-palpable adnexa, no masses or tenderness.     Extremities:   Extremities normal, atraumatic, no cyanosis or edema  Pulses:   2+ and symmetric all extremities  Skin:   Skin color, texture, turgor normal, no rashes or lesions  Lymph nodes:   Cervical, supraclavicular, and axillary nodes normal  Neurologic:   CNII-XII intact, normal strength, sensation and reflexes    throughout  .    Assessment:    Healthy female exam.    Plan:     Mammogram.  Come back next year/prn sooner SVE taught and encouraged monthly

## 2022-05-20 ENCOUNTER — Other Ambulatory Visit: Payer: Self-pay | Admitting: Obstetrics & Gynecology

## 2022-05-20 DIAGNOSIS — Z1231 Encounter for screening mammogram for malignant neoplasm of breast: Secondary | ICD-10-CM

## 2022-05-27 ENCOUNTER — Other Ambulatory Visit: Payer: Self-pay | Admitting: Obstetrics & Gynecology

## 2022-05-27 MED ORDER — ESTRADIOL 0.1 MG/GM VA CREA: TOPICAL_CREAM | VAGINAL | 12 refills | Status: DC

## 2022-05-27 NOTE — Progress Notes (Signed)
Estradiol cream prescription changed to one that I am told will be covered.

## 2022-05-29 ENCOUNTER — Telehealth: Payer: Self-pay

## 2022-05-29 NOTE — Telephone Encounter (Signed)
Spoke with patient. Patient states the rx for Estrace was sent in as brand name only. Her insurance required a prior authorization that was denied. Requesting approval for generic.

## 2022-05-29 NOTE — Telephone Encounter (Signed)
Per Myressa H. Patient called to discuss insurance coverage on Estrace Cream. [phone].

## 2022-06-05 ENCOUNTER — Ambulatory Visit
Admission: RE | Admit: 2022-06-05 | Discharge: 2022-06-05 | Disposition: A | Discharge status: Home / Self Care | Payer: Medicare PPO | Source: Ambulatory Visit | Attending: Obstetrics & Gynecology | Admitting: Obstetrics & Gynecology

## 2022-06-05 DIAGNOSIS — Z1231 Encounter for screening mammogram for malignant neoplasm of breast: Secondary | ICD-10-CM | POA: Diagnosis not present

## 2022-06-06 NOTE — Telephone Encounter (Signed)
Request recv'd from [facility] for rx for generic of estrace vaginal 0.1 mg/gm vaginal cream. Ins will not cover brand only.

## 2022-06-07 ENCOUNTER — Ambulatory Visit
Admission: RE | Admit: 2022-06-07 | Discharge: 2022-06-07 | Disposition: A | Discharge status: Home / Self Care | Payer: Medicare PPO | Source: Ambulatory Visit | Attending: Emergency Medicine | Admitting: Emergency Medicine

## 2022-06-07 VITALS — BP 129/71 | HR 76 | Temp 98.0°F | Resp 14 | Ht 61.0 in | Wt 120.0 lb

## 2022-06-07 DIAGNOSIS — U071 COVID-19: Secondary | ICD-10-CM | POA: Diagnosis present

## 2022-06-07 LAB — RESP PANEL BY RT-PCR (FLU A&B, COVID) ARPGX2
Influenza A by PCR: NEGATIVE
Influenza B by PCR: NEGATIVE
SARS Coronavirus 2 by RT PCR: POSITIVE — AB

## 2022-06-07 MED ORDER — BENZONATATE 100 MG PO CAPS: 200.0000 mg | ORAL_CAPSULE | Freq: Three times a day (TID) | ORAL | 0 refills | Status: DC

## 2022-06-07 MED ORDER — IPRATROPIUM BROMIDE 0.06 % NA SOLN: 2.0000 | Freq: Four times a day (QID) | NASAL | 12 refills | Status: DC

## 2022-06-07 MED ORDER — PROMETHAZINE-DM 6.25-15 MG/5ML PO SYRP: 5.0000 mL | ORAL_SOLUTION | Freq: Four times a day (QID) | ORAL | 0 refills | Status: DC | PRN

## 2022-06-07 MED ORDER — NIRMATRELVIR/RITONAVIR (PAXLOVID)TABLET: 3.0000 | ORAL_TABLET | Freq: Two times a day (BID) | ORAL | 0 refills | Status: AC

## 2022-06-07 NOTE — Discharge Instructions (Addendum)
You have tested positive for COVID at home and I will treat you for COVID-19 infection.  You will need to quarantine for 5 days from onset of symptoms.  After 5 days you may break quarantine if your symptoms have improved and you have not run a fever without taking Tylenol or ibuprofen for 24 hours.  You still need to wear a mask around other people for an additional 5 days.  Take the Paxlovid twice daily for 5 days for treatment of COVID-19.  Use the Atrovent nasal spray, 2 squirts in each nostril every 6 hours, as needed for runny nose and postnasal drip.  Use the Tessalon Perles every 8 hours during the day.  Take them with a small sip of water.  They may give you some numbness to the base of your tongue or a metallic taste in your mouth, this is normal.  Use the Promethazine DM cough syrup at bedtime for cough and congestion.  It will make you drowsy so do not take it during the day.  If you develop any shortness of breath, especially at rest, feel you cannot catch her breath, you are unable to speak in full sentences, or is a late sign your lips begin turning blue you need to go to the ER for evaluation.  Please call 911 and do so.

## 2022-06-07 NOTE — ED Provider Notes (Signed)
[facility]    CSN: [id number] Arrival date & time: 06/07/22  3532      History   Chief Complaint Chief Complaint  Patient presents with   Generalized Body Aches    COVID + home test   Chills    HPI Tiffany [name] is a 71 y.o. female.   HPI  71 year old female here for evaluation of respiratory complaints.  Patient reports that her symptoms began 2 days ago and they consist of runny nose, nasal congestion and, body aches, and chills.  She states that her cough is intermittently productive but is not significant.  This is not associated with any fever, sore throat, ear pain, shortness of breath, wheezing, or GI complaints.  Patient does have significant past medical history to include osteopenia, IBS, anxiety, hypertension, high cholesterol, and prediabetes.  Also history of basal cell carcinoma.  Past Medical History:  Diagnosis Date   Allergy    Anxiety    Basal cell carcinoma    Basal cell carcinoma (BCC) of right upper arm 04/27/2008   R deltoid    DDD (degenerative disc disease), lumbar 08/05/2018   History of dysplastic nevus 07/21/2019   R lower quad abd 99ME inf lat to umbilicus/moderate atypia    Hx of dysplastic nevus 06/21/2015   L distal lat deltoid/ mod atypia    Hx of dysplastic nevus 06/21/2015   R distal ant thigh/ severe atypa    Hx of dysplastic nevus 06/15/2014   left side above waist/ mild atypia   Hx of dysplastic nevus 06/21/2009   Left ant axilla/ slight atypia   Hypercholesterolemia 08/05/2019   Hypertension    IBS (irritable bowel syndrome)    Orthodontics    multiple dental implants   Osteopenia    Plantar fasciitis    Prediabetes    Wears contact lenses    Right eye    Patient Active Problem List   Diagnosis Date Noted   Vaginal atrophy 04/22/2017   IBS (irritable bowel syndrome) 01/09/2017   Hypercholesterolemia 07/30/2016   Impaired glucose tolerance 07/30/2016   Osteoporosis, 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   Vulvar  pain 03/28/2015   Muscular pain 07/13/2014    Past Surgical History:  Procedure Laterality Date   BREAST BIOPSY Right 03/20/2014   negative   BROW LIFT Bilateral 02/14/2022   Procedure: BLEPHAROPLASTY UPPER EYELID; WITH EXCESS SKIN, BILATERAL BLEPHAROPTOSIS REPAIR, RESECT EX BILATEAL SHAVE BIOPSY RIGHT LOWER EYELID;  Surgeon: Karle Starch, MD;  Location: [place];  Service: Ophthalmology;  Laterality: Bilateral;   COLONOSCOPY     COLONOSCOPY WITH PROPOFOL N/A 08/15/2020   Procedure: COLONOSCOPY WITH PROPOFOL;  Surgeon: Toledo, Benay Pike, MD;  Location: [facility];  Service: Gastroenterology;  Laterality: N/A;   dental implants     EYE SURGERY     FRACTURE SURGERY  01/2007   nose fracture-repair   RHINOPLASTY  2008   For fractured nose   UPPER GI ENDOSCOPY      OB History     Gravida  0   Para  0   Term  0   Preterm  0   AB  0   Living  0      SAB  0   IAB  0   Ectopic  0   Multiple  0   Live Births  0            Home Medications    Prior to Admission medications   Medication Sig Start Date  End Date Taking? Authorizing Provider  benzonatate (TESSALON) 100 MG capsule Take 2 capsules (200 mg total) by mouth every 8 (eight) hours. 06/07/22  Yes Margarette Canada, NP  Biotin 1000 MCG tablet Take 1 tablet by mouth daily.   Yes [provider]  Calcium-Magnesium-Vitamin D (CALCIUM 1200+D3 PO) Take by mouth.   Yes [provider]  cholecalciferol (VITAMIN D3) 10 MCG (400 UNIT) TABS tablet Take 1,000 Units by mouth daily.   Yes [provider]  estradiol (ESTRACE) 0.1 MG/GM vaginal cream Apply 1 gram per vagina every night for 2 weeks, then apply three times a week 05/27/22  Yes Dove, Myra C, MD  fexofenadine (ALLEGRA) 180 MG tablet Take 180 mg by mouth daily as needed for allergies.  04/21/19  Yes [provider]  ipratropium (ATROVENT) 0.06 % nasal spray Place 2 sprays into both nostrils 4 (four) times daily. 06/07/22   Yes Margarette Canada, NP  metoprolol succinate (TOPROL-XL) 25 MG 24 hr tablet Take by mouth. 10/10/21 10/10/22 Yes [provider]  Multiple Vitamin (MULTIVITAMIN) capsule Take 1 capsule by mouth daily.   Yes [provider]  nirmatrelvir/ritonavir EUA (PAXLOVID) 20 x 150 MG & 10 x '100MG'$  TABS Take 3 tablets by mouth 2 (two) times daily for 5 days. 06/07/22 06/12/22 Yes Margarette Canada, NP  omeprazole (PRILOSEC) 20 MG capsule Take 20 mg by mouth daily as needed.   Yes [provider]  promethazine-dextromethorphan (PROMETHAZINE-DM) 6.25-15 MG/5ML syrup Take 5 mLs by mouth 4 (four) times daily as needed. 06/07/22  Yes Margarette Canada, NP  pseudoephedrine (SUDAFED) 30 MG tablet Take 30 mg by mouth every 4 (four) hours as needed for congestion.   Yes [provider]  acetaminophen (TYLENOL) 325 MG tablet Take 325 mg by mouth every 6 (six) hours as needed for mild pain.    [provider]  ALPRAZolam Duanne Moron) 0.25 MG tablet Take 1 tablet (0.25 mg total) by mouth as needed. 05/06/21   Gae Dry, MD  B Complex Vitamins (B COMPLEX 1 PO) Take by mouth daily.    [provider]  Cranberry 200 MG CAPS Take 2 capsules by mouth 2 (two) times daily.     [provider]  cyclobenzaprine (FLEXERIL) 5 MG tablet Take 2.5 mg by mouth at bedtime as needed for muscle spasms.    [provider]  diphenhydrAMINE (BENADRYL) 25 mg capsule Take 25 mg by mouth every 6 (six) hours as needed.    [provider]  ibuprofen (ADVIL) 200 MG tablet Take 200 mg by mouth every 6 (six) hours as needed for moderate pain.    [provider]  Lactobacillus Acidophilus POWD Take 1 capsule by mouth 2 (two) times daily.    [provider]  naproxen sodium (ALEVE) 220 MG tablet Take 1 tablet by mouth 2 (two) times daily as needed (pain).     [provider]  NONFORMULARY OR COMPOUNDED ITEM Sm 4 Azeliac Acid 15%, Metronidazole 1%, Ivermectin 1% for  rosacea    [provider]  phenylephrine-shark liver oil-mineral oil-petrolatum (PREPARATION H) 0.25-3-14-71.9 % rectal ointment Place 1 application rectally 2 (two) times daily as needed for hemorrhoids.    [provider]  triamcinolone (NASACORT) 55 MCG/ACT AERO nasal inhaler Place 2 sprays into the nose daily.    [provider]    Family History Family History  Problem Relation Age of Onset   Colon cancer Mother    Hypertension Mother    Basal  cell carcinoma Mother    Osteoporosis Mother    Diabetes Father    Hypertension Father    Heart disease Father    Alzheimer's disease Father    Breast cancer Neg Hx     Social History Social History   Tobacco Use   Smoking status: Never   Smokeless tobacco: Never  Vaping Use   Vaping Use: Never used  Substance Use Topics   Alcohol use: No   Drug use: No     Allergies   Prednisone and Sulfa antibiotics   Review of Systems Review of Systems  Constitutional:  Positive for chills. Negative for fever.  HENT:  Positive for congestion and rhinorrhea. Negative for ear pain and sore throat.   Respiratory:  Positive for cough. Negative for shortness of breath and wheezing.   Gastrointestinal:  Negative for diarrhea, nausea and vomiting.  Musculoskeletal:  Positive for arthralgias and myalgias.  Skin:  Negative for rash.  Hematological: Negative.   Psychiatric/Behavioral: Negative.       Physical Exam Triage Vital Signs ED Triage Vitals  Enc Vitals Group     BP 06/07/22 0943 129/71     Pulse Rate 06/07/22 0943 76     Resp 06/07/22 0943 14     Temp 06/07/22 0943 98 F (36.7 C)     Temp Source 06/07/22 0943 Oral     SpO2 06/07/22 0943 95 %     Weight 06/07/22 0940 120 lb (54.4 kg)     Height 06/07/22 0940 '5\' 1"'$  (1.549 m)     Head Circumference --      Peak Flow --      Pain Score 06/07/22 0940 4     Pain Loc --      Pain Edu? --      Excl. in [place]? --    No data found.  Updated Vital  Signs BP 129/71 (BP Location: Left Arm)   Pulse 76   Temp 98 F (36.7 C) (Oral)   Resp 14   Ht '5\' 1"'$  (1.549 m)   Wt 120 lb (54.4 kg)   SpO2 95%   BMI 22.67 kg/m   Visual Acuity Right Eye Distance:   Left Eye Distance:   Bilateral Distance:    Right Eye Near:   Left Eye Near:    Bilateral Near:     Physical Exam Vitals and nursing note reviewed.  Constitutional:      Appearance: Normal appearance. She is not ill-appearing.  HENT:     Head: Normocephalic and atraumatic.     Right Ear: Tympanic membrane, ear canal and external ear normal. There is no impacted cerumen.     Left Ear: Tympanic membrane, ear canal and external ear normal. There is no impacted cerumen.     Nose: Congestion and rhinorrhea present.     Mouth/Throat:     Mouth: Mucous membranes are moist.     Pharynx: Oropharynx is clear. Posterior oropharyngeal erythema present. No oropharyngeal exudate.  Cardiovascular:     Rate and Rhythm: Normal rate and regular rhythm.     Pulses: Normal pulses.     Heart sounds: Normal heart sounds. No murmur heard.    No friction rub. No gallop.  Pulmonary:     Effort: Pulmonary effort is normal.     Breath sounds: Normal breath sounds. No wheezing, rhonchi or rales.  Musculoskeletal:     Cervical back: Normal range of motion and neck supple.  Lymphadenopathy:     Cervical:  No cervical adenopathy.  Skin:    General: Skin is warm and dry.     Capillary Refill: Capillary refill takes less than 2 seconds.     Findings: No erythema or rash.  Neurological:     General: No focal deficit present.     Mental Status: She is alert and oriented to person, place, and time.  Psychiatric:        Mood and Affect: Mood normal.        Behavior: Behavior normal.        Thought Content: Thought content normal.        Judgment: Judgment normal.      UC Treatments / Results  Labs (all labs ordered are listed, but only abnormal results are displayed) Labs Reviewed  RESP PANEL  BY RT-PCR (FLU A&B, COVID) ARPGX2 - Abnormal; Notable for the following components:      Result Value   SARS Coronavirus 2 by RT PCR POSITIVE (*)    All other components within normal limits    EKG   Radiology   Procedures Procedures (including critical care time)  Medications Ordered in UC Medications - No data to display  Initial Impression / Assessment and Plan / UC Course  I have reviewed the triage vital signs and the nursing notes.  Pertinent labs & imaging results that were available during my care of the patient were reviewed by me and considered in my medical decision making (see chart for details).   Patient is a nontoxic-appearing 71 year old female here for evaluation of respiratory symptoms as outlined in HPI above.  Patient took a home COVID test yesterday and it was positive.  She is requesting to be retested here today for confirmation.  Patient is not in any type of respiratory distress and she is able speak in full sentences without dyspnea or tachypnea.  Her physical exam reveals pearly-gray tympanic membranes bilaterally with normal light reflex and clear external auditory canals.  Her nasal mucosa is erythematous and edematous with clear discharge in both nares.  Oropharyngeal exam reveals mild posterior oropharyngeal erythema and injection with clear postnasal drip.  No anterior cervical lymphadenopathy appreciable exam.  Cardiopulmonary exam reveals S1-S2 heart sounds with regular rate and rhythm and lung sounds are clear auscultation all fields.  No rashes noted on her skin.  Respiratory triplex panel was collected at triage.  I advised the patient that if a home test was positive.  She has COVID and I have offered her the antiviral therapy which she has agreed to.  I will also prescribe Atrovent nasal spray, Tessalon Perles, and Promethazine DM cough syrup to help with symptomology as needed.  ER precautions reviewed.   Final Clinical Impressions(s) / UC Diagnoses    Final diagnoses:  [id number]     Discharge Instructions      You have tested positive for COVID at home and I will treat you for COVID-19 infection.  You will need to quarantine for 5 days from onset of symptoms.  After 5 days you may break quarantine if your symptoms have improved and you have not run a fever without taking Tylenol or ibuprofen for 24 hours.  You still need to wear a mask around other people for an additional 5 days.  Take the Paxlovid twice daily for 5 days for treatment of COVID-19.  Use the Atrovent nasal spray, 2 squirts in each nostril every 6 hours, as needed for runny nose and postnasal drip.  Use the [employer] every  8 hours during the day.  Take them with a small sip of water.  They may give you some numbness to the base of your tongue or a metallic taste in your mouth, this is normal.  Use the Promethazine DM cough syrup at bedtime for cough and congestion.  It will make you drowsy so do not take it during the day.  If you develop any shortness of breath, especially at rest, feel you cannot catch her breath, you are unable to speak in full sentences, or is a late sign your lips begin turning blue you need to go to the ER for evaluation.  Please call 911 and do so.     ED Prescriptions     Medication Sig Dispense Auth. Provider   nirmatrelvir/ritonavir EUA (PAXLOVID) 20 x 150 MG & 10 x '100MG'$  TABS Take 3 tablets by mouth 2 (two) times daily for 5 days. 30 tablet Margarette Canada, NP   benzonatate (TESSALON) 100 MG capsule Take 2 capsules (200 mg total) by mouth every 8 (eight) hours. 21 capsule Margarette Canada, NP   ipratropium (ATROVENT) 0.06 % nasal spray Place 2 sprays into both nostrils 4 (four) times daily. 15 mL Margarette Canada, NP   promethazine-dextromethorphan (PROMETHAZINE-DM) 6.25-15 MG/5ML syrup Take 5 mLs by mouth 4 (four) times daily as needed. 118 mL Margarette Canada, NP      PDMP not reviewed this encounter.   Margarette Canada, NP 06/07/22 1032

## 2022-06-07 NOTE — ED Triage Notes (Addendum)
Patient c/o runny nose, congestion, bodyaches, and chills that started 2 days ago.  Patient unsure fevers.  Patient did a home covid test yeasterday and was positive for COVID.  Patient states that she wants a COVID and flu test here today.

## 2022-06-12 NOTE — Telephone Encounter (Signed)
Another request recv'd from pharm.  Will Send msg to Dr. Hulan Fray.

## 2022-06-16 ENCOUNTER — Other Ambulatory Visit: Payer: Self-pay | Admitting: Obstetrics & Gynecology

## 2022-06-16 MED ORDER — ESTRADIOL 0.1 MG/GM VA CREA
TOPICAL_CREAM | VAGINAL | 12 refills | Status: DC
Start: 2022-06-16 — End: 2022-06-16

## 2022-06-16 NOTE — Telephone Encounter (Signed)
Per Dr. Hulan Fray, generic is okay. Called pharmacy and verbal taken.

## 2022-08-07 ENCOUNTER — Ambulatory Visit: Payer: Medicare PPO | Admitting: Dermatology

## 2022-08-07 DIAGNOSIS — L821 Other seborrheic keratosis: Secondary | ICD-10-CM | POA: Diagnosis not present

## 2022-08-07 DIAGNOSIS — L820 Inflamed seborrheic keratosis: Secondary | ICD-10-CM | POA: Diagnosis not present

## 2022-08-07 NOTE — Progress Notes (Signed)
   Follow-Up Visit   Subjective  Tiffany [name] is a 71 y.o. female who presents for the following: Skin Problem (The patient has spots, moles and lesions to be evaluated, some may be new or changing and the patient has concerns that these could be cancer. ).  The following portions of the chart were reviewed this encounter and updated as appropriate:   Tobacco  Allergies  Meds  Problems  Med Hx  Surg Hx  Fam Hx     Review of Systems:  No other skin or systemic complaints except as noted in HPI or Assessment and Plan.  Objective  Well appearing patient in no apparent distress; mood and affect are within normal limits.  A focused examination was performed including face,chest. Relevant physical exam findings are noted in the Assessment and Plan.  left chin x 1, chest x 20  (21) (21) Stuck-on, waxy, tan-brown papule  --Discussed benign etiology and prognosis.    Assessment & Plan  Inflamed seborrheic keratosis (21) left chin x 1, chest x 20  (21)  Symptomatic, irritating, patient would like treated.   Destruction of lesion - left chin x 1, chest x 20  (21) Complexity: simple   Destruction method: cryotherapy   Informed consent: discussed and consent obtained   Timeout:  patient name, date of birth, surgical site, and procedure verified Lesion destroyed using liquid nitrogen: Yes   Region frozen until ice ball extended beyond lesion: Yes   Outcome: patient tolerated procedure well with no complications   Post-procedure details: wound care instructions given    Seborrheic Keratoses - Stuck-on, waxy, tan-brown papules and/or plaques  - Benign-appearing - Discussed benign etiology and prognosis. - Observe - Call for any changes  Return for TBSE as scheduled March 2024.  IMarye Round, CMA, am acting as scribe for Sarina Ser, MD .  Documentation: I have reviewed the above documentation for accuracy and completeness, and I agree with the above.  Sarina Ser,  MD

## 2022-08-07 NOTE — Patient Instructions (Addendum)
Seborrheic Keratosis  What causes seborrheic keratoses? Seborrheic keratoses are harmless, common skin growths that first appear during adult life.  As time goes by, more growths appear.  Some people may develop a large number of them.  Seborrheic keratoses appear on both covered and uncovered body parts.  They are not caused by sunlight.  The tendency to develop seborrheic keratoses can be inherited.  They vary in color from skin-colored to gray, brown, or even black.  They can be either smooth or have a rough, warty surface.   Seborrheic keratoses are superficial and look as if they were stuck on the skin.  Under the microscope this type of keratosis looks like layers upon layers of skin.  That is why at times the top layer may seem to fall off, but the rest of the growth remains and re-grows.    Treatment Seborrheic keratoses do not need to be treated, but can easily be removed in the office.  Seborrheic keratoses often cause symptoms when they rub on clothing or jewelry.  Lesions can be in the way of shaving.  If they become inflamed, they can cause itching, soreness, or burning.  Removal of a seborrheic keratosis can be accomplished by freezing, burning, or surgery. If any spot bleeds, scabs, or grows rapidly, please return to have it checked, as these can be an indication of a skin cancer.  Cryotherapy Aftercare  Wash gently with soap and water everyday.   Apply Vaseline and Band-Aid daily until healed.    Due to recent changes in healthcare laws, you may see results of your pathology and/or laboratory studies on MyChart before the doctors have had a chance to review them. We understand that in some cases there may be results that are confusing or concerning to you. Please understand that not all results are received at the same time and often the doctors may need to interpret multiple results in order to provide you with the best plan of care or course of treatment. Therefore, we ask that you  please give us 2 business days to thoroughly review all your results before contacting the office for clarification. Should we see a critical lab result, you will be contacted sooner.   If You Need Anything After Your Visit  If you have any questions or concerns for your doctor, please call our main line at [phone] and press option 4 to reach your doctor's medical assistant. If no one answers, please leave a voicemail as directed and we will return your call as soon as possible. Messages left after 4 pm will be answered the following business day.   You may also send us a message via MyChart. We typically respond to MyChart messages within 1-2 business days.  For prescription refills, please ask your pharmacy to contact our office. Our fax number is [phone].  If you have an urgent issue when the clinic is closed that cannot wait until the next business day, you can page your doctor at the number below.    Please note that while we do our best to be available for urgent issues outside of office hours, we are not available 24/7.   If you have an urgent issue and are unable to reach us, you may choose to seek medical care at your doctor's office, retail clinic, urgent care center, or emergency room.  If you have a medical emergency, please immediately call 911 or go to the emergency department.  Pager Numbers  - Dr. Kowalski: [phone]  -   Dr. Moye: [phone]  - Dr. Stewart: [phone]  In the event of inclement weather, please call our main line at [phone] for an update on the status of any delays or closures.  Dermatology Medication Tips: Please keep the boxes that topical medications come in in order to help keep track of the instructions about where and how to use these. Pharmacies typically print the medication instructions only on the boxes and not directly on the medication tubes.   If your medication is too expensive, please contact our office at  [phone] option 4 or send us a message through MyChart.   We are unable to tell what your co-pay for medications will be in advance as this is different depending on your insurance coverage. However, we may be able to find a substitute medication at lower cost or fill out paperwork to get insurance to cover a needed medication.   If a prior authorization is required to get your medication covered by your insurance company, please allow us 1-2 business days to complete this process.  Drug prices often vary depending on where the prescription is filled and some pharmacies may offer cheaper prices.  The website www.goodrx.com contains coupons for medications through different pharmacies. The prices here do not account for what the cost may be with help from insurance (it may be cheaper with your insurance), but the website can give you the price if you did not use any insurance.  - You can print the associated coupon and take it with your prescription to the pharmacy.  - You may also stop by our office during regular business hours and pick up a GoodRx coupon card.  - If you need your prescription sent electronically to a different pharmacy, notify our office through [facility] MyChart or by phone at [phone] option 4.     Si Usted Necesita Algo Despus de Su Visita  Tambin puede enviarnos un mensaje a travs de MyChart. Por lo general respondemos a los mensajes de MyChart en el transcurso de 1 a 2 das hbiles.  Para renovar recetas, por favor pida a su farmacia que se ponga en contacto con nuestra oficina. Nuestro nmero de fax es el [phone].  Si tiene un asunto urgente cuando la clnica est cerrada y que no puede esperar hasta el siguiente da hbil, puede llamar/localizar a su doctor(a) al nmero que aparece a continuacin.   Por favor, tenga en cuenta que aunque hacemos todo lo posible para estar disponibles para asuntos urgentes fuera del horario de oficina, no estamos  disponibles las 24 horas del da, los 7 das de la semana.   Si tiene un problema urgente y no puede comunicarse con nosotros, puede optar por buscar atencin mdica  en el consultorio de su doctor(a), en una clnica privada, en un centro de atencin urgente o en una sala de emergencias.  Si tiene una emergencia mdica, por favor llame inmediatamente al 911 o vaya a la sala de emergencias.  Nmeros de bper  - Dr. Kowalski: [phone]  - Dra. Moye: [phone]  - Dra. Stewart: [phone]  En caso de inclemencias del tiempo, por favor llame a nuestra lnea principal al [phone] para una actualizacin sobre el estado de cualquier retraso o cierre.  Consejos para la medicacin en dermatologa: Por favor, guarde las cajas en las que vienen los medicamentos de uso tpico para ayudarle a seguir las instrucciones sobre dnde y cmo usarlos. Las farmacias generalmente imprimen las instrucciones del medicamento slo en las cajas y   no directamente en los tubos del medicamento.   Si su medicamento es muy caro, por favor, pngase en contacto con nuestra oficina llamando al [phone] y presione la opcin 4 o envenos un mensaje a travs de MyChart.   No podemos decirle cul ser su copago por los medicamentos por adelantado ya que esto es diferente dependiendo de la cobertura de su seguro. Sin embargo, es posible que podamos encontrar un medicamento sustituto a menor costo o llenar un formulario para que el seguro cubra el medicamento que se considera necesario.   Si se requiere una autorizacin previa para que su compaa de seguros cubra su medicamento, por favor permtanos de 1 a 2 das hbiles para completar este proceso.  Los precios de los medicamentos varan con frecuencia dependiendo del lugar de dnde se surte la receta y alguna farmacias pueden ofrecer precios ms baratos.  El sitio web www.goodrx.com tiene cupones para medicamentos de diferentes farmacias. Los precios aqu no  tienen en cuenta lo que podra costar con la ayuda del seguro (puede ser ms barato con su seguro), pero el sitio web puede darle el precio si no utiliz ningn seguro.  - Puede imprimir el cupn correspondiente y llevarlo con su receta a la farmacia.  - Tambin puede pasar por nuestra oficina durante el horario de atencin regular y recoger una tarjeta de cupones de GoodRx.  - Si necesita que su receta se enve electrnicamente a una farmacia diferente, informe a nuestra oficina a travs de MyChart de [facility] o por telfono llamando al [phone] y presione la opcin 4.  

## 2022-08-16 ENCOUNTER — Encounter: Payer: Self-pay | Admitting: Dermatology

## 2022-11-17 ENCOUNTER — Ambulatory Visit (LOCAL_COMMUNITY_HEALTH_CENTER): Payer: Medicare PPO

## 2022-11-17 DIAGNOSIS — Z7185 Encounter for immunization safety counseling: Secondary | ICD-10-CM

## 2022-11-17 DIAGNOSIS — Z23 Encounter for immunization: Secondary | ICD-10-CM

## 2022-11-17 NOTE — Progress Notes (Signed)
In nurse clinic for covid vaccine. [place] [id number] (12 yrs+) given and tolerated well. Updated [facility] copy given. Stayed for 15 min observation without problem. Josie Saunders, RN

## 2022-11-25 ENCOUNTER — Other Ambulatory Visit: Payer: Self-pay | Admitting: Infectious Diseases

## 2022-11-25 DIAGNOSIS — R1031 Right lower quadrant pain: Secondary | ICD-10-CM

## 2022-11-28 ENCOUNTER — Ambulatory Visit
Admission: RE | Admit: 2022-11-28 | Discharge: 2022-11-28 | Disposition: A | Discharge status: Home / Self Care | Payer: Medicare PPO | Source: Ambulatory Visit | Attending: Infectious Diseases | Admitting: Infectious Diseases

## 2022-11-28 ENCOUNTER — Other Ambulatory Visit: Payer: Self-pay | Admitting: Infectious Diseases

## 2022-11-28 DIAGNOSIS — R1031 Right lower quadrant pain: Secondary | ICD-10-CM

## 2022-12-01 ENCOUNTER — Ambulatory Visit: Payer: Medicare PPO | Admitting: Dermatology

## 2022-12-01 VITALS — BP 143/72 | HR 71

## 2022-12-01 DIAGNOSIS — L219 Seborrheic dermatitis, unspecified: Secondary | ICD-10-CM

## 2022-12-01 DIAGNOSIS — L918 Other hypertrophic disorders of the skin: Secondary | ICD-10-CM

## 2022-12-01 DIAGNOSIS — Z79899 Other long term (current) drug therapy: Secondary | ICD-10-CM

## 2022-12-01 DIAGNOSIS — L821 Other seborrheic keratosis: Secondary | ICD-10-CM

## 2022-12-01 DIAGNOSIS — Z1283 Encounter for screening for malignant neoplasm of skin: Secondary | ICD-10-CM | POA: Diagnosis not present

## 2022-12-01 DIAGNOSIS — L719 Rosacea, unspecified: Secondary | ICD-10-CM | POA: Diagnosis not present

## 2022-12-01 DIAGNOSIS — L720 Epidermal cyst: Secondary | ICD-10-CM

## 2022-12-01 DIAGNOSIS — Q825 Congenital non-neoplastic nevus: Secondary | ICD-10-CM

## 2022-12-01 DIAGNOSIS — L814 Other melanin hyperpigmentation: Secondary | ICD-10-CM

## 2022-12-01 DIAGNOSIS — D229 Melanocytic nevi, unspecified: Secondary | ICD-10-CM

## 2022-12-01 DIAGNOSIS — Z86018 Personal history of other benign neoplasm: Secondary | ICD-10-CM

## 2022-12-01 DIAGNOSIS — L820 Inflamed seborrheic keratosis: Secondary | ICD-10-CM

## 2022-12-01 DIAGNOSIS — L578 Other skin changes due to chronic exposure to nonionizing radiation: Secondary | ICD-10-CM

## 2022-12-01 DIAGNOSIS — Z85828 Personal history of other malignant neoplasm of skin: Secondary | ICD-10-CM

## 2022-12-01 MED ORDER — METRONIDAZOLE 1 % EX GEL: CUTANEOUS | 11 refills | Status: AC

## 2022-12-01 NOTE — Progress Notes (Signed)
Follow-Up Visit   Subjective  Tiffany [name] is a 72 y.o. female who presents for the following: Annual Exam (Hx BCC, dysplastic nevi). The patient presents for Total-Body Skin Exam (TBSE) for skin cancer screening and mole check.  The patient has spots, moles and lesions to be evaluated, some may be new or changing and the patient has concerns that these could be cancer.  The following portions of the chart were reviewed this encounter and updated as appropriate:   Tobacco  Allergies  Meds  Problems  Med Hx  Surg Hx  Fam Hx     Review of Systems:  No other skin or systemic complaints except as noted in HPI or Assessment and Plan.  Objective  Well appearing patient in no apparent distress; mood and affect are within normal limits.  A full examination was performed including scalp, head, eyes, ears, nose, lips, neck, chest, axillae, abdomen, back, buttocks, bilateral upper extremities, bilateral lower extremities, hands, feet, fingers, toes, fingernails, and toenails. All findings within normal limits unless otherwise noted below.  R cheek x 4, abdomen x 1 (5) Erythematous stuck-on, waxy papule or plaque  Scalp Pink patches with greasy scale.    Assessment & Plan  Inflamed seborrheic keratosis (5) R cheek x 4, abdomen x 1 Symptomatic, irritating, patient would like treated. Destruction of lesion - R cheek x 4, abdomen x 1 Complexity: simple   Destruction method: cryotherapy   Informed consent: discussed and consent obtained   Timeout:  patient name, date of birth, surgical site, and procedure verified Lesion destroyed using liquid nitrogen: Yes   Region frozen until ice ball extended beyond lesion: Yes   Outcome: patient tolerated procedure well with no complications   Post-procedure details: wound care instructions given    Rosacea Face Rosacea is a chronic progressive skin condition usually affecting the face of adults, causing redness and/or acne bumps. It is treatable  but not curable. It sometimes affects the eyes (ocular rosacea) as well. It may respond to topical and/or systemic medication and can flare with stress, sun exposure, alcohol, exercise, topical steroids (including hydrocortisone/cortisone 10) and some foods.  Daily application of broad spectrum spf 30+ sunscreen to face is recommended to reduce flares.  Continue Skin Medicinals mix QHS PRN.  metroNIDAZOLE (METROGEL) 1 % gel - Face Apply to face QHS.  Congenital nevus L ant thigh Benign-appearing.  Observation.  Call clinic for new or changing lesions.  Recommend daily use of broad spectrum spf 30+ sunscreen to sun-exposed areas.    Seborrheic dermatitis Scalp Seborrheic Dermatitis  -  is a chronic persistent rash characterized by pinkness and scaling most commonly of the mid face but also can occur on the scalp (dandruff), ears; mid chest, mid back and groin.  It tends to be exacerbated by stress and cooler weather.  People who have neurologic disease may experience new onset or exacerbation of existing seborrheic dermatitis.  The condition is not curable but treatable and can be controlled.  Patient defers Ketoconazole 2% shampoo at this time, and if she changes her mind she will contact the office for a prescription.   Lentigines - Scattered tan macules - Due to sun exposure - Benign-appearing, observe - Recommend daily broad spectrum sunscreen SPF 30+ to sun-exposed areas, reapply every 2 hours as needed. - Call for any changes  Seborrheic Keratoses - Stuck-on, waxy, tan-brown papules and/or plaques  - Benign-appearing - Discussed benign etiology and prognosis. - Observe - Call for any changes  Melanocytic Nevi - Tan-brown and/or pink-flesh-colored symmetric macules and papules - Benign appearing on exam today - Observation - Call clinic for new or changing moles - Recommend daily use of broad spectrum spf 30+ sunscreen to sun-exposed areas.   Hemangiomas - Red papules -  Discussed benign nature - Observe - Call for any changes  Actinic Damage - Chronic condition, secondary to cumulative UV/sun exposure - diffuse scaly erythematous macules with underlying dyspigmentation - Recommend daily broad spectrum sunscreen SPF 30+ to sun-exposed areas, reapply every 2 hours as needed.  - Staying in the shade or wearing long sleeves, sun glasses (UVA+UVB protection) and wide brim hats (4-inch brim around the entire circumference of the hat) are also recommended for sun protection.  - Call for new or changing lesions.  History of Dysplastic Nevi - No evidence of recurrence today - Recommend regular full body skin exams - Recommend daily broad spectrum sunscreen SPF 30+ to sun-exposed areas, reapply every 2 hours as needed.  - Call if any new or changing lesions are noted between office visits  History of Basal Cell Carcinoma of the Skin - No evidence of recurrence today - Recommend regular full body skin exams - Recommend daily broad spectrum sunscreen SPF 30+ to sun-exposed areas, reapply every 2 hours as needed.  - Call if any new or changing lesions are noted between office visits  Milia - tiny firm white papules - type of cyst - benign - may be extracted if symptomatic - observe  Acrochordons (Skin Tags) - Fleshy, skin-colored pedunculated papules - Benign appearing.  - Observe. - If desired, they can be removed with an in office procedure that is not covered by insurance. - Please call the clinic if you notice any new or changing lesions.  Skin cancer screening performed today.  Return in about 1 year (around 12/01/2023) for TBSE.  Luther Redo, CMA, am acting as scribe for Sarina Ser, MD . Documentation: I have reviewed the above documentation for accuracy and completeness, and I agree with the above.  Sarina Ser, MD

## 2022-12-01 NOTE — Patient Instructions (Signed)
Due to recent changes in healthcare laws, you may see results of your pathology and/or laboratory studies on MyChart before the doctors have had a chance to review them. We understand that in some cases there may be results that are confusing or concerning to you. Please understand that not all results are received at the same time and often the doctors may need to interpret multiple results in order to provide you with the best plan of care or course of treatment. Therefore, we ask that you please give us 2 business days to thoroughly review all your results before contacting the office for clarification. Should we see a critical lab result, you will be contacted sooner.   If You Need Anything After Your Visit  If you have any questions or concerns for your doctor, please call our main line at [phone] and press option 4 to reach your doctor's medical assistant. If no one answers, please leave a voicemail as directed and we will return your call as soon as possible. Messages left after 4 pm will be answered the following business day.   You may also send us a message via MyChart. We typically respond to MyChart messages within 1-2 business days.  For prescription refills, please ask your pharmacy to contact our office. Our fax number is [phone].  If you have an urgent issue when the clinic is closed that cannot wait until the next business day, you can page your doctor at the number below.    Please note that while we do our best to be available for urgent issues outside of office hours, we are not available 24/7.   If you have an urgent issue and are unable to reach us, you may choose to seek medical care at your doctor's office, retail clinic, urgent care center, or emergency room.  If you have a medical emergency, please immediately call 911 or go to the emergency department.  Pager Numbers  - Dr. Kowalski: [phone]  - Dr. Moye: [phone]  - Dr. Stewart:  [phone]  In the event of inclement weather, please call our main line at [phone] for an update on the status of any delays or closures.  Dermatology Medication Tips: Please keep the boxes that topical medications come in in order to help keep track of the instructions about where and how to use these. Pharmacies typically print the medication instructions only on the boxes and not directly on the medication tubes.   If your medication is too expensive, please contact our office at [phone] option 4 or send us a message through MyChart.   We are unable to tell what your co-pay for medications will be in advance as this is different depending on your insurance coverage. However, we may be able to find a substitute medication at lower cost or fill out paperwork to get insurance to cover a needed medication.   If a prior authorization is required to get your medication covered by your insurance company, please allow us 1-2 business days to complete this process.  Drug prices often vary depending on where the prescription is filled and some pharmacies may offer cheaper prices.  The website www.goodrx.com contains coupons for medications through different pharmacies. The prices here do not account for what the cost may be with help from insurance (it may be cheaper with your insurance), but the website can give you the price if you did not use any insurance.  - You can print the associated coupon and take it with   your prescription to the pharmacy.  - You may also stop by our office during regular business hours and pick up a GoodRx coupon card.  - If you need your prescription sent electronically to a different pharmacy, notify our office through [facility] MyChart or by phone at [phone] option 4.     Si Usted Necesita Algo Despus de Su Visita  Tambin puede enviarnos un mensaje a travs de MyChart. Por lo general respondemos a los mensajes de MyChart en el transcurso de 1 a 2  das hbiles.  Para renovar recetas, por favor pida a su farmacia que se ponga en contacto con nuestra oficina. Nuestro nmero de fax es el [phone].  Si tiene un asunto urgente cuando la clnica est cerrada y que no puede esperar hasta el siguiente da hbil, puede llamar/localizar a su doctor(a) al nmero que aparece a continuacin.   Por favor, tenga en cuenta que aunque hacemos todo lo posible para estar disponibles para asuntos urgentes fuera del horario de oficina, no estamos disponibles las 24 horas del da, los 7 das de la semana.   Si tiene un problema urgente y no puede comunicarse con nosotros, puede optar por buscar atencin mdica  en el consultorio de su doctor(a), en una clnica privada, en un centro de atencin urgente o en una sala de emergencias.  Si tiene una emergencia mdica, por favor llame inmediatamente al 911 o vaya a la sala de emergencias.  Nmeros de bper  - Dr. Kowalski: [phone]  - Dra. Moye: [phone]  - Dra. Stewart: [phone]  En caso de inclemencias del tiempo, por favor llame a nuestra lnea principal al [phone] para una actualizacin sobre el estado de cualquier retraso o cierre.  Consejos para la medicacin en dermatologa: Por favor, guarde las cajas en las que vienen los medicamentos de uso tpico para ayudarle a seguir las instrucciones sobre dnde y cmo usarlos. Las farmacias generalmente imprimen las instrucciones del medicamento slo en las cajas y no directamente en los tubos del medicamento.   Si su medicamento es muy caro, por favor, pngase en contacto con nuestra oficina llamando al [phone] y presione la opcin 4 o envenos un mensaje a travs de MyChart.   No podemos decirle cul ser su copago por los medicamentos por adelantado ya que esto es diferente dependiendo de la cobertura de su seguro. Sin embargo, es posible que podamos encontrar un medicamento sustituto a menor costo o llenar un formulario para que el  seguro cubra el medicamento que se considera necesario.   Si se requiere una autorizacin previa para que su compaa de seguros cubra su medicamento, por favor permtanos de 1 a 2 das hbiles para completar este proceso.  Los precios de los medicamentos varan con frecuencia dependiendo del lugar de dnde se surte la receta y alguna farmacias pueden ofrecer precios ms baratos.  El sitio web www.goodrx.com tiene cupones para medicamentos de diferentes farmacias. Los precios aqu no tienen en cuenta lo que podra costar con la ayuda del seguro (puede ser ms barato con su seguro), pero el sitio web puede darle el precio si no utiliz ningn seguro.  - Puede imprimir el cupn correspondiente y llevarlo con su receta a la farmacia.  - Tambin puede pasar por nuestra oficina durante el horario de atencin regular y recoger una tarjeta de cupones de GoodRx.  - Si necesita que su receta se enve electrnicamente a una farmacia diferente, informe a nuestra oficina a travs de MyChart de [place]   o por telfono llamando al [phone] y presione la opcin 4.  

## 2022-12-06 ENCOUNTER — Encounter: Payer: Self-pay | Admitting: Dermatology

## 2023-04-24 ENCOUNTER — Other Ambulatory Visit: Payer: Self-pay

## 2023-04-24 ENCOUNTER — Telehealth: Payer: Self-pay | Admitting: Obstetrics and Gynecology

## 2023-04-24 NOTE — Telephone Encounter (Signed)
Dr. Valentino Saxon, Patient called in wanting to know if you could go ahead and drop the order for her Mammogram.  Her appointment is 05/22/2023 with you.  Please advise.

## 2023-04-26 ENCOUNTER — Other Ambulatory Visit: Payer: Self-pay | Admitting: Obstetrics and Gynecology

## 2023-04-26 DIAGNOSIS — Z1231 Encounter for screening mammogram for malignant neoplasm of breast: Secondary | ICD-10-CM

## 2023-04-26 NOTE — Telephone Encounter (Signed)
That's fine, I will place the order. And just FYI, in the future these types of messages can go to my nurse.  As long as they are already scheduled for an annual.

## 2023-05-20 NOTE — Progress Notes (Signed)
ANNUAL PREVENTATIVE CARE GYNECOLOGY  ENCOUNTER NOTE  Subjective:       Tiffany [name] is a 72 y.o. G0P0000 female here for a routine annual gynecologic exam. The patient is not sexually active. The patient is taking hormone replacement therapy. Patient denies post-menopausal vaginal bleeding. The patient wears seatbelts: yes. The patient participates in regular exercise: yes. Has the patient ever been transfused or tattooed?: no. The patient reports that there is not domestic violence in her life.  Current complaints: 1.  Thinks she may have a skin tag on her vulva/perineal area. Very small, not bothersome.     Gynecologic History No LMP recorded. Patient is postmenopausal. Contraception: post menopausal status Last Pap: 05/06/2021. Results were: normal Last mammogram: 06/05/2022. Results were: normal Last Colonoscopy: 08/15/2020: 10 years Last Dexa Scan:  08/04/2018   Obstetric History OB History  Gravida Para Term Preterm AB Living  0 0 0 0 0 0  SAB IAB Ectopic Multiple Live Births  0 0 0 0 0    Past Medical History:  Diagnosis Date   Allergy    Anxiety    Basal cell carcinoma    Basal cell carcinoma (BCC) of right upper arm 04/27/2008   R deltoid    DDD (degenerative disc disease), lumbar 08/05/2018   History of dysplastic nevus 07/21/2019   R lower quad abd 11cm inf lat to umbilicus/moderate atypia    Hx of dysplastic nevus 06/21/2015   L distal lat deltoid/ mod atypia    Hx of dysplastic nevus 06/21/2015   R distal ant thigh/ severe atypa    Hx of dysplastic nevus 06/15/2014   left side above waist/ mild atypia   Hx of dysplastic nevus 06/21/2009   Left ant axilla/ slight atypia   Hypercholesterolemia 08/05/2019   Hypertension    IBS (irritable bowel syndrome)    Orthodontics    multiple dental implants   Osteopenia    Plantar fasciitis    Prediabetes    Wears contact lenses    Right eye    Family History  Problem Relation Age of Onset   Colon  cancer Mother    Hypertension Mother    Basal cell carcinoma Mother    Osteoporosis Mother    Diabetes Father    Hypertension Father    Heart disease Father    Alzheimer's disease Father    Breast cancer Neg Hx     Past Surgical History:  Procedure Laterality Date   BREAST BIOPSY Right 03/20/2014   negative   BROW LIFT Bilateral 02/14/2022   Procedure: BLEPHAROPLASTY UPPER EYELID; WITH EXCESS SKIN, BILATERAL BLEPHAROPTOSIS REPAIR, RESECT EX BILATEAL SHAVE BIOPSY RIGHT LOWER EYELID;  Surgeon: Imagene Riches, MD;  Location: [facility];  Service: Ophthalmology;  Laterality: Bilateral;   COLONOSCOPY     COLONOSCOPY WITH PROPOFOL N/A 08/15/2020   Procedure: COLONOSCOPY WITH PROPOFOL;  Surgeon: Toledo, Boykin Nearing, MD;  Location: [facility];  Service: Gastroenterology;  Laterality: N/A;   dental implants     EYE SURGERY     FRACTURE SURGERY  01/2007   nose fracture-repair   RHINOPLASTY  2008   For fractured nose   UPPER GI ENDOSCOPY      Social History   Socioeconomic History   Marital status: Single    Spouse name: Not on file   Number of children: Not on file   Years of education: Not on file   Highest education level: Not on file  Occupational History   Not  on file  Tobacco Use   Smoking status: Never   Smokeless tobacco: Never  Vaping Use   Vaping status: Never Used  Substance and Sexual Activity   Alcohol use: No   Drug use: No   Sexual activity: Not Currently    Birth control/protection: Post-menopausal  Other Topics Concern   Not on file  Social History Narrative   Not on file   Social Determinants of Health   Financial Resource Strain: Patient Declined (05/18/2023)   Received from [facility]   Overall Financial Resource Strain (CARDIA)    Difficulty of Paying Living Expenses: Patient declined  Food Insecurity: Patient Declined (05/18/2023)   Received from [facility]   Hunger Vital Sign    Worried About  Running Out of Food in the Last Year: Patient declined    Ran Out of Food in the Last Year: Patient declined  Transportation Needs: Patient Declined (05/18/2023)   Received from [facility] - Transportation    In the past 12 months, has lack of transportation kept you from medical appointments or from getting medications?: Patient declined    Lack of Transportation (Non-Medical): Patient declined  Physical Activity: Not on file  Stress: Not on file  Social Connections: Unknown (07/09/2022)   Received from [facility]   Social Network    Social Network: Not on file  Intimate Partner Violence: Unknown (07/09/2022)   Received from [facility]   HITS    Physically Hurt: Not on file    Insult or Talk Down To: Not on file    Threaten Physical Harm: Not on file    Scream or Curse: Not on file    Current Outpatient Medications on File Prior to Visit  Medication Sig Dispense Refill   acetaminophen (TYLENOL) 325 MG tablet Take 325 mg by mouth every 6 (six) hours as needed for mild pain.     ALPRAZolam (XANAX) 0.25 MG tablet Take 1 tablet (0.25 mg total) by mouth as needed. 30 tablet 2   B Complex Vitamins (B COMPLEX 1 PO) Take by mouth daily.     benzonatate (TESSALON) 100 MG capsule Take 2 capsules (200 mg total) by mouth every 8 (eight) hours. 21 capsule 0   Biotin 1000 MCG tablet Take 1 tablet by mouth daily.     Calcium-Magnesium-Vitamin D (CALCIUM 1200+D3 PO) Take by mouth.     cholecalciferol (VITAMIN D3) 10 MCG (400 UNIT) TABS tablet Take 1,000 Units by mouth daily.     Cranberry 200 MG CAPS Take 2 capsules by mouth 2 (two) times daily.      cyclobenzaprine (FLEXERIL) 5 MG tablet Take 2.5 mg by mouth at bedtime as needed for muscle spasms.     diphenhydrAMINE (BENADRYL) 25 mg capsule Take 25 mg by mouth every 6 (six) hours as needed.     estradiol (ESTRACE) 0.1 MG/GM vaginal cream Apply 1 gram per vagina every night for 2  weeks, then apply three times a week 30 g 12   fexofenadine (ALLEGRA) 180 MG tablet Take 180 mg by mouth daily as needed for allergies.      ibuprofen (ADVIL) 200 MG tablet Take 200 mg by mouth every 6 (six) hours as needed for moderate pain.     ipratropium (ATROVENT) 0.06 % nasal spray Place 2 sprays into both nostrils 4 (four) times daily. 15 mL 12   Lactobacillus Acidophilus POWD Take 1 capsule by mouth 2 (  two) times daily.     metroNIDAZOLE (METROGEL) 1 % gel Apply to face QHS. 30 g 11   Multiple Vitamin (MULTIVITAMIN) capsule Take 1 capsule by mouth daily.     naproxen sodium (ALEVE) 220 MG tablet Take 1 tablet by mouth 2 (two) times daily as needed (pain).      NONFORMULARY OR COMPOUNDED ITEM Sm 4 Azeliac Acid 15%, Metronidazole 1%, Ivermectin 1% for rosacea     omeprazole (PRILOSEC) 20 MG capsule Take 20 mg by mouth daily as needed.     phenylephrine-shark liver oil-mineral oil-petrolatum (PREPARATION H) 0.25-3-14-71.9 % rectal ointment Place 1 application rectally 2 (two) times daily as needed for hemorrhoids.     promethazine-dextromethorphan (PROMETHAZINE-DM) 6.25-15 MG/5ML syrup Take 5 mLs by mouth 4 (four) times daily as needed. 118 mL 0   pseudoephedrine (SUDAFED) 30 MG tablet Take 30 mg by mouth every 4 (four) hours as needed for congestion.     triamcinolone (NASACORT) 55 MCG/ACT AERO nasal inhaler Place 2 sprays into the nose daily.     No current facility-administered medications on file prior to visit.    Allergies  Allergen Reactions   Prednisone Other (See Comments)    Body aches with oral prednisone   Sulfa Antibiotics Nausea Only      Review of Systems ROS Review of Systems - General ROS: negative for - chills, fatigue, fever, hot flashes, night sweats, weight gain or weight loss Psychological ROS: negative for - anxiety, decreased libido, depression, mood swings, physical abuse or sexual abuse Ophthalmic ROS: negative for - blurry vision, eye pain or loss of  vision ENT ROS: negative for - headaches, hearing change, visual changes or vocal changes Allergy and Immunology ROS: negative for - hives, itchy/watery eyes or seasonal allergies Hematological and Lymphatic ROS: negative for - bleeding problems, bruising, swollen lymph nodes or weight loss Endocrine ROS: negative for - galactorrhea, hair pattern changes, hot flashes, malaise/lethargy, mood swings, palpitations, polydipsia/polyuria, skin changes, temperature intolerance or unexpected weight changes Breast ROS: negative for - new or changing breast lumps or nipple discharge Respiratory ROS: negative for - cough or shortness of breath Cardiovascular ROS: negative for - chest pain, irregular heartbeat, palpitations or shortness of breath Gastrointestinal ROS: no abdominal pain, change in bowel habits, or black or bloody stools Genito-Urinary ROS: no dysuria, trouble voiding, or hematuria Musculoskeletal ROS: negative for - joint pain or joint stiffness Neurological ROS: negative for - bowel and bladder control changes Dermatological ROS: negative for rash and skin lesion changes   Objective:   BP (!) 154/82   Pulse 70   Resp 16   Ht 5\' 1"  (1.549 m)   Wt 117 lb 9.6 oz (53.3 kg)   BMI 22.22 kg/m   Repeat BP 153/81 at end of visit.  CONSTITUTIONAL: Well-developed, well-nourished female in no acute distress.  PSYCHIATRIC: Normal mood and affect. Normal behavior. Normal judgment and thought content. NEUROLGIC: Alert and oriented to person, place, and time. Normal muscle tone coordination. No cranial nerve deficit noted. HENT:  Normocephalic, atraumatic, External right and left ear normal. Oropharynx is clear and moist EYES: Conjunctivae and EOM are normal. Pupils are equal, round, and reactive to light. No scleral icterus.  NECK: Normal range of motion, supple, no masses.  Normal thyroid.  SKIN: Skin is warm and dry. No rash noted. Not diaphoretic. No erythema. No pallor. CARDIOVASCULAR: Normal  heart rate noted, regular rhythm, no murmur. RESPIRATORY: Clear to auscultation bilaterally. Effort and breath sounds normal, no problems with  respiration noted. BREASTS: Symmetric in size. No masses, skin changes, nipple drainage, or lymphadenopathy. ABDOMEN: Soft, normal bowel sounds, no distention noted.  No tenderness, rebound or guarding.  BLADDER: Normal PELVIC:  Bladder no bladder distension noted  Urethra: normal appearing urethra with no masses, tenderness or lesions  Vulva: mild vulvar atrophy, no lesions or tenderness. No skin tag noted.  Vagina: moderately atrophic, no lesions or discharge. Needs pediatric speculum for exam.   Cervix: partially flushed with vaginal wall. No lesions or tenderness  Uterus: uterus is normal size, shape, consistency and nontender  Adnexa: normal adnexa in size, nontender and no masses  RV: External Exam NormaI, No Rectal Masses, and Normal Sphincter tone  MUSCULOSKELETAL: Normal range of motion. No tenderness.  No cyanosis, clubbing, or edema.  2+ distal pulses. LYMPHATIC: No Axillary, Supraclavicular, or Inguinal Adenopathy.   Labs: Labs performed by PCP.   Assessment:   1. Encounter for well woman exam with routine gynecological exam   2. Vaginal atrophy   3. Vulvar atrophy   4. Essential hypertension      Plan:  - Pap: Not needed, beyond screening age. - Mammogram: Ordered, scheduled for next month.  - Colon Screening:   UTD - Labs:  performed by PCP - Routine preventative health maintenance measures emphasized:  Self Breast Exam and Exercise/Diet/Weight control - Anticipatory guidance for age-based vaccine schedule.  - Desires refill on Estrace cream.  - Hypertension, patient notes new BP med added ~ 1 month ago. Also placed on Lipitor and baby aspirin by PCP. States her BPs are usually more elevated at doctors' offices, was 119/72 at home this morning prior to her appointment.  - Return to Clinic - 1 Year   Hildred Laser,  MD [facility]

## 2023-05-22 ENCOUNTER — Encounter: Payer: Self-pay | Admitting: Obstetrics and Gynecology

## 2023-05-22 ENCOUNTER — Ambulatory Visit (INDEPENDENT_AMBULATORY_CARE_PROVIDER_SITE_OTHER): Payer: Medicare PPO | Admitting: Obstetrics and Gynecology

## 2023-05-22 VITALS — BP 153/81 | HR 67 | Resp 16 | Ht 61.0 in | Wt 117.6 lb

## 2023-05-22 DIAGNOSIS — Z131 Encounter for screening for diabetes mellitus: Secondary | ICD-10-CM

## 2023-05-22 DIAGNOSIS — Z01419 Encounter for gynecological examination (general) (routine) without abnormal findings: Secondary | ICD-10-CM | POA: Diagnosis not present

## 2023-05-22 DIAGNOSIS — E7800 Pure hypercholesterolemia, unspecified: Secondary | ICD-10-CM

## 2023-05-22 DIAGNOSIS — N905 Atrophy of vulva: Secondary | ICD-10-CM

## 2023-05-22 DIAGNOSIS — N952 Postmenopausal atrophic vaginitis: Secondary | ICD-10-CM

## 2023-05-22 DIAGNOSIS — I10 Essential (primary) hypertension: Secondary | ICD-10-CM

## 2023-05-22 MED ORDER — ESTRADIOL 0.1 MG/GM VA CREA: TOPICAL_CREAM | VAGINAL | 6 refills | Status: DC

## 2023-05-22 NOTE — Patient Instructions (Signed)
Preventive Care 65 Years and Older, Female Preventive care refers to lifestyle choices and visits with your health care provider that can promote health and wellness. Preventive care visits are also called wellness exams. What can I expect for my preventive care visit? Counseling Your health care provider may ask you questions about your: Medical history, including: Past medical problems. Family medical history. Pregnancy and menstrual history. History of falls. Current health, including: Memory and ability to understand (cognition). Emotional well-being. Home life and relationship well-being. Sexual activity and sexual health. Lifestyle, including: Alcohol, nicotine or tobacco, and drug use. Access to firearms. Diet, exercise, and sleep habits. Work and work environment. Sunscreen use. Safety issues such as seatbelt and bike helmet use. Physical exam Your health care provider will check your: Height and weight. These may be used to calculate your BMI (body mass index). BMI is a measurement that tells if you are at a healthy weight. Waist circumference. This measures the distance around your waistline. This measurement also tells if you are at a healthy weight and may help predict your risk of certain diseases, such as type 2 diabetes and high blood pressure. Heart rate and blood pressure. Body temperature. Skin for abnormal spots. What immunizations do I need?  Vaccines are usually given at various ages, according to a schedule. Your health care provider will recommend vaccines for you based on your age, medical history, and lifestyle or other factors, such as travel or where you work. What tests do I need? Screening Your health care provider may recommend screening tests for certain conditions. This may include: Lipid and cholesterol levels. Hepatitis C test. Hepatitis B test. HIV (human immunodeficiency virus) test. STI (sexually transmitted infection) testing, if you are at  risk. Lung cancer screening. Colorectal cancer screening. Diabetes screening. This is done by checking your blood sugar (glucose) after you have not eaten for a while (fasting). Mammogram. Talk with your health care provider about how often you should have regular mammograms. BRCA-related cancer screening. This may be done if you have a family history of breast, ovarian, tubal, or peritoneal cancers. Bone density scan. This is done to screen for osteoporosis. Talk with your health care provider about your test results, treatment options, and if necessary, the need for more tests. Follow these instructions at home: Eating and drinking  Eat a diet that includes fresh fruits and vegetables, whole grains, lean protein, and low-fat dairy products. Limit your intake of foods with high amounts of sugar, saturated fats, and salt. Take vitamin and mineral supplements as recommended by your health care provider. Do not drink alcohol if your health care provider tells you not to drink. If you drink alcohol: Limit how much you have to 0-1 drink a day. Know how much alcohol is in your drink. In the [place] one drink equals one 12 oz bottle of beer (355 mL), one 5 oz glass of wine (148 mL), or one 1 oz glass of hard liquor (44 mL). Lifestyle Brush your teeth every morning and night with fluoride toothpaste. Floss one time each day. Exercise for at least 30 minutes 5 or more days each week. Do not use any products that contain nicotine or tobacco. These products include cigarettes, chewing tobacco, and vaping devices, such as e-cigarettes. If you need help quitting, ask your health care provider. Do not use drugs. If you are sexually active, practice safe sex. Use a condom or other form of protection in order to prevent STIs. Take aspirin only as told by   your health care provider. Make sure that you understand how much to take and what form to take. Work with your health care provider to find out whether it  is safe and beneficial for you to take aspirin daily. Ask your health care provider if you need to take a cholesterol-lowering medicine (statin). Find healthy ways to manage stress, such as: Meditation, yoga, or listening to music. Journaling. Talking to a trusted person. Spending time with friends and family. Minimize exposure to UV radiation to reduce your risk of skin cancer. Safety Always wear your seat belt while driving or riding in a vehicle. Do not drive: If you have been drinking alcohol. Do not ride with someone who has been drinking. When you are tired or distracted. While texting. If you have been using any mind-altering substances or drugs. Wear a helmet and other protective equipment during sports activities. If you have firearms in your house, make sure you follow all gun safety procedures. What's next? Visit your health care provider once a year for an annual wellness visit. Ask your health care provider how often you should have your eyes and teeth checked. Stay up to date on all vaccines. This information is not intended to replace advice given to you by your health care provider. Make sure you discuss any questions you have with your health care provider. Document Revised: 03/13/2021 Document Reviewed: 03/13/2021 Elsevier Patient Education  2024 Elsevier Inc[employer] Breast Self-Awareness Breast self-awareness is knowing how your breasts look and feel. You need to: Check your breasts on a regular basis. Tell your doctor about any changes. Become familiar with the look and feel of your breasts. This can help you catch a breast problem while it is still small and can be treated. You should do breast self-exams even if you have breast implants. What you need: A mirror. A well-lit room. A pillow or other soft object. How to do a breast self-exam Follow these steps to do a breast self-exam: Look for changes  Take off all the clothes above your waist. Stand in front of a  mirror in a room with good lighting. Put your hands down at your sides. Compare your breasts in the mirror. Look for any difference between them, such as: A difference in shape. A difference in size. Wrinkles, dips, and bumps in one breast and not the other. Look at each breast for changes in the skin, such as: Redness. Scaly areas. Skin that has gotten thicker. Dimpling. Open sores (ulcers). Look for changes in your nipples, such as: Fluid coming out of a nipple. Fluid around a nipple. Bleeding. Dimpling. Redness. A nipple that looks pushed in (retracted), or that has changed position. Feel for changes Lie on your back. Feel each breast. To do this: Pick a breast to feel. Place a pillow under the shoulder closest to that breast. Put the arm closest to that breast behind your head. Feel the nipple area of that breast using the hand of your other arm. Feel the area with the pads of your three middle fingers by making small circles with your fingers. Use light, medium, and firm pressure. Continue the overlapping circles, moving downward over the breast. Keep making circles with your fingers. Stop when you feel your ribs. Start making circles with your fingers again, this time going upward until you reach your collarbone. Then, make circles outward across your breast and into your armpit area. Squeeze your nipple. Check for discharge and lumps. Repeat these steps to check your other   breast. Sit or stand in the tub or shower. With soapy water on your skin, feel each breast the same way you did when you were lying down. Write down what you find Writing down what you find can help you remember what to tell your doctor. Write down: What is normal for each breast. Any changes you find in each breast. These include: The kind of changes you find. A tender or painful breast. Any lump you find. Write down its size and where it is. When you last had your monthly period (menstrual  cycle). General tips If you are breastfeeding, the best time to check your breasts is after you feed your baby or after you use a breast pump. If you get monthly bleeding, the best time to check your breasts is 5-7 days after your monthly cycle ends. With time, you will become comfortable with the self-exam. You will also start to know if there are changes in your breasts. Contact a doctor if: You see a change in the shape or size of your breasts or nipples. You see a change in the skin of your breast or nipples, such as red or scaly skin. You have fluid coming from your nipples that is not normal. You find a new lump or thick area. You have breast pain. You have any concerns about your breast health. Summary Breast self-awareness includes looking for changes in your breasts and feeling for changes within your breasts. You should do breast self-awareness in front of a mirror in a well-lit room. If you get monthly periods (menstrual cycles), the best time to check your breasts is 5-7 days after your period ends. Tell your doctor about any changes you see in your breasts. Changes include changes in size, changes on the skin, painful or tender breasts, or fluid from your nipples that is not normal. This information is not intended to replace advice given to you by your health care provider. Make sure you discuss any questions you have with your health care provider. Document Revised: 02/20/2022 Document Reviewed: 07/18/2021 Elsevier Patient Education  2024 Elsevier Inc.  

## 2023-06-03 ENCOUNTER — Encounter (INDEPENDENT_AMBULATORY_CARE_PROVIDER_SITE_OTHER): Payer: Self-pay

## 2023-06-03 ENCOUNTER — Encounter (INDEPENDENT_AMBULATORY_CARE_PROVIDER_SITE_OTHER): Payer: Medicare PPO

## 2023-06-03 ENCOUNTER — Ambulatory Visit (INDEPENDENT_AMBULATORY_CARE_PROVIDER_SITE_OTHER): Payer: Medicare PPO | Admitting: Nurse Practitioner

## 2023-06-03 ENCOUNTER — Encounter (INDEPENDENT_AMBULATORY_CARE_PROVIDER_SITE_OTHER): Payer: Self-pay | Admitting: Nurse Practitioner

## 2023-06-03 VITALS — BP 167/83 | HR 74 | Resp 18 | Ht 60.0 in | Wt 122.6 lb

## 2023-06-03 DIAGNOSIS — E7800 Pure hypercholesterolemia, unspecified: Secondary | ICD-10-CM

## 2023-06-03 DIAGNOSIS — I6523 Occlusion and stenosis of bilateral carotid arteries: Secondary | ICD-10-CM | POA: Diagnosis not present

## 2023-06-03 DIAGNOSIS — I10 Essential (primary) hypertension: Secondary | ICD-10-CM | POA: Diagnosis not present

## 2023-06-03 NOTE — Progress Notes (Signed)
Subjective:    Patient ID: Tiffany [name], female    DOB: Apr 22, 1951, 72 y.o.   MRN: [id number] Chief Complaint  Patient presents with   New Patient (Initial Visit)    Np consult with carotid    Tiffany [name] is a 72 year old female who presents as a referral from Dr. Sampson Goon due to carotid artery stenosis and dizziness.  The patient notes that she has had dizziness for approximately a year or so.  It is she notes that the dizziness occurred for approximately a year.  She does not endorse a specific pattern that is associated with the dizziness.  It is not always associated with activity but the patient largely notes that she does not have an identifiable pattern the wound occurs.  Additionally concerning for the patient as her blood pressure.  She was previously on metoprolol 25 mg and she was weaned down and noted that she had more elevated blood pressures.  She has since been started on amlodipine 2.5 mg which she takes typically once per day but sometimes twice if it is elevated at her afternoon blood pressure checks.  She does note that she becomes anxious with anticipation of her blood pressure checks and it tends to be elevated at that time.  She has not had any history of CVA or TIA-like symptoms.  She does not describe any amaurosis fugax.  She notes that she was recently placed on Lipitor and aspirin by her primary care as well.  In the midst of the workup the patient underwent a carotid artery duplex which showed a 50 to 69% stenosis bilaterally however the ICA/CCA ratio suggest a lower stenosis of about 40 to 59%.  She presents to [place] today for further workup.    Review of Systems  Musculoskeletal:  Positive for arthralgias.  Neurological:  Positive for dizziness and light-headedness.  Psychiatric/Behavioral:  The patient is nervous/anxious.   All other systems reviewed and are negative.      Objective:   Physical Exam Vitals reviewed.  HENT:     Head: Normocephalic.   Neck:     Vascular: No carotid bruit.  Cardiovascular:     Rate and Rhythm: Normal rate and regular rhythm.     Pulses: Normal pulses.     Heart sounds: Normal heart sounds.  Pulmonary:     Effort: Pulmonary effort is normal.  Skin:    General: Skin is warm and dry.  Neurological:     Mental Status: She is alert and oriented to person, place, and time.  Psychiatric:        Mood and Affect: Mood normal.        Behavior: Behavior normal.        Thought Content: Thought content normal.        Judgment: Judgment normal.     BP (!) 167/83 (BP Location: Right Arm)   Pulse 74   Resp 18   Ht 5' (1.524 m)   Wt 122 lb 9.6 oz (55.6 kg)   BMI 23.94 kg/m   Past Medical History:  Diagnosis Date   Allergy    Anxiety    Basal cell carcinoma    Basal cell carcinoma (BCC) of right upper arm 04/27/2008   R deltoid    DDD (degenerative disc disease), lumbar 08/05/2018   History of dysplastic nevus 07/21/2019   R lower quad abd 11cm inf lat to umbilicus/moderate atypia    Hx of dysplastic nevus 06/21/2015   L distal lat deltoid/  mod atypia    Hx of dysplastic nevus 06/21/2015   R distal ant thigh/ severe atypa    Hx of dysplastic nevus 06/15/2014   left side above waist/ mild atypia   Hx of dysplastic nevus 06/21/2009   Left ant axilla/ slight atypia   Hypercholesterolemia 08/05/2019   Hypertension    IBS (irritable bowel syndrome)    Orthodontics    multiple dental implants   Osteopenia    Plantar fasciitis    Prediabetes    Wears contact lenses    Right eye    Social History   Socioeconomic History   Marital status: Single    Spouse name: Not on file   Number of children: Not on file   Years of education: Not on file   Highest education level: Not on file  Occupational History   Not on file  Tobacco Use   Smoking status: Never   Smokeless tobacco: Never  Vaping Use   Vaping status: Never Used  Substance and Sexual Activity   Alcohol use: No   Drug use: No    Sexual activity: Not Currently    Birth control/protection: Post-menopausal  Other Topics Concern   Not on file  Social History Narrative   Not on file   Social Determinants of Health   Financial Resource Strain: Patient Declined (05/18/2023)   Received from [facility]   Overall Financial Resource Strain (CARDIA)    Difficulty of Paying Living Expenses: Patient declined  Food Insecurity: Patient Declined (05/18/2023)   Received from [facility]   Hunger Vital Sign    Worried About Running Out of Food in the Last Year: Patient declined    Ran Out of Food in the Last Year: Patient declined  Transportation Needs: Patient Declined (05/18/2023)   Received from [facility]   PRAPARE - Transportation    In the past 12 months, has lack of transportation kept you from medical appointments or from getting medications?: Patient declined    Lack of Transportation (Non-Medical): Patient declined  Physical Activity: Not on file  Stress: Not on file  Social Connections: Unknown (07/09/2022)   Received from [facility]   Social Network    Social Network: Not on file  Intimate Partner Violence: Unknown (07/09/2022)   Received from [facility]   HITS    Physically Hurt: Not on file    Insult or Talk Down To: Not on file    Threaten Physical Harm: Not on file    Scream or Curse: Not on file    Past Surgical History:  Procedure Laterality Date   BREAST BIOPSY Right 03/20/2014   negative   BROW LIFT Bilateral 02/14/2022   Procedure: BLEPHAROPLASTY UPPER EYELID; WITH EXCESS SKIN, BILATERAL BLEPHAROPTOSIS REPAIR, RESECT EX BILATEAL SHAVE BIOPSY RIGHT LOWER EYELID;  Surgeon: Imagene Riches, MD;  Location: [facility];  Service: Ophthalmology;  Laterality: Bilateral;   COLONOSCOPY     COLONOSCOPY WITH PROPOFOL N/A 08/15/2020   Procedure: COLONOSCOPY WITH PROPOFOL;  Surgeon: Toledo, Boykin Nearing, MD;   Location: [facility];  Service: Gastroenterology;  Laterality: N/A;   dental implants     EYE SURGERY     FRACTURE SURGERY  01/2007   nose fracture-repair   RHINOPLASTY  2008   For fractured nose   UPPER GI ENDOSCOPY      Family History  Problem Relation Age of Onset   Colon cancer Mother    Hypertension  Mother    Basal cell carcinoma Mother    Osteoporosis Mother    Diabetes Father    Hypertension Father    Heart disease Father    Alzheimer's disease Father    Breast cancer Neg Hx     Allergies  Allergen Reactions   Prednisone Other (See Comments)    Body aches with oral prednisone   Sulfa Antibiotics Nausea Only        No data to display            CMP  No results found for: "NA", "K", "CL", "CO2", "GLUCOSE", "BUN", "CREATININE", "CALCIUM", "PROT", "ALBUMIN", "AST", "ALT", "ALKPHOS", "BILITOT", "GFR", "EGFR", "GFRNONAA"   No results found.     Assessment & Plan:   1. Bilateral carotid artery stenosis Had a long discussion with the patient regarding carotid disease the patient.  We discussed carotid stenosis in general and the measures used such as the carotid duplex versus CT scan.  We also discussed her symptoms extensively.  Given that the patient has some discrepancy between velocities in her carotid duplex which suggest that she may have a higher stenosis that initially estimated we would likely be in her best interest to move forward with CT angiogram in order to evaluate whether there is artery stenosis.  This is due to the fact that in some cases densely calcified plaque and would be underestimated based on ultrasound.  Additionally the ultrasound done at the outside office did not evaluate the subclavian vessels which if the patient had some evidence of subclavian steal that could account for her dizziness.  Overall I believe we will be in the patient's best interest to move forward with CT angiogram just to gain a better overall evaluation of her  carotid status.  We discussed the patient undergoing a CT angiogram and the possible outcomes leading to either continue to follow-up the ultrasound versus possible intervention.  Following the CT scan we will have the patient follow-up to review and discuss next steps. - CT ANGIO NECK W OR WO CONTRAST; Future  2. Essential hypertension I had a discussion with the patient regarding her blood pressures today as well as reviewed some of the numbers that she had trending over the last several weeks.  I do not believe that the issues with her blood pressure overtly related to her carotid disease.  I suspect that her anxiety is playing a larger role in this especially with the adjustment of her blood pressure medications.  I have recommended that because 2.5 mg of amlodipine is a relatively low dose that she start trying the second 2.5 mg dose in the afternoon/evening and seeing if that yields better blood pressure control throughout the day.  Overall titration of blood pressure medications however we will still continue with the patient's primary care provider.  3. Hypercholesterolemia Continue statin as ordered and reviewed, no changes at this time   Current Outpatient Medications on File Prior to Visit  Medication Sig Dispense Refill   acetaminophen (TYLENOL) 325 MG tablet Take 325 mg by mouth every 6 (six) hours as needed for mild pain.     ALPRAZolam (XANAX) 0.25 MG tablet Take 1 tablet (0.25 mg total) by mouth as needed. 30 tablet 2   amLODipine (NORVASC) 2.5 MG tablet 2.5 mg daily.     atorvastatin (LIPITOR) 10 MG tablet Take 10 mg by mouth daily.     BABY ASPIRIN PO Take 81 mg by mouth daily.     Biotin 1000 MCG  tablet Take 1 tablet by mouth daily.     Calcium-Magnesium-Vitamin D (CALCIUM 1200+D3 PO) Take by mouth.     cholecalciferol (VITAMIN D3) 10 MCG (400 UNIT) TABS tablet Take 1,000 Units by mouth daily.     Cranberry 200 MG CAPS Take 2 capsules by mouth 2 (two) times daily.       cyclobenzaprine (FLEXERIL) 5 MG tablet Take 2.5 mg by mouth at bedtime as needed for muscle spasms.     diphenhydrAMINE (BENADRYL) 25 mg capsule Take 25 mg by mouth every 6 (six) hours as needed.     estradiol (ESTRACE) 0.1 MG/GM vaginal cream Apply 1 gram per vagina three times a week 30 g 6   fexofenadine (ALLEGRA) 180 MG tablet Take 180 mg by mouth daily as needed for allergies.      ibuprofen (ADVIL) 200 MG tablet Take 200 mg by mouth every 6 (six) hours as needed for moderate pain.     Lactobacillus Acidophilus POWD Take 1 capsule by mouth 2 (two) times daily.     MAGNESIUM PO Take 1 tablet by mouth daily.     metroNIDAZOLE (METROGEL) 1 % gel Apply to face QHS. 30 g 11   Multiple Vitamin (MULTIVITAMIN) capsule Take 1 capsule by mouth daily.     naproxen sodium (ALEVE) 220 MG tablet Take 1 tablet by mouth 2 (two) times daily as needed (pain).      NONFORMULARY OR COMPOUNDED ITEM Sm 4 Azeliac Acid 15%, Metronidazole 1%, Ivermectin 1% for rosacea     omeprazole (PRILOSEC) 20 MG capsule Take 20 mg by mouth daily as needed.     phenylephrine-shark liver oil-mineral oil-petrolatum (PREPARATION H) 0.25-3-14-71.9 % rectal ointment Place 1 application rectally 2 (two) times daily as needed for hemorrhoids.     pseudoephedrine (SUDAFED) 30 MG tablet Take 30 mg by mouth every 4 (four) hours as needed for congestion.     triamcinolone (NASACORT) 55 MCG/ACT AERO nasal inhaler Place 2 sprays into the nose daily.     No current facility-administered medications on file prior to visit.    There are no Patient Instructions on file for this visit. No follow-ups on file.   Georgiana Spinner, NP

## 2023-06-05 ENCOUNTER — Ambulatory Visit
Admission: RE | Admit: 2023-06-05 | Discharge: 2023-06-05 | Disposition: A | Discharge status: Home / Self Care | Payer: Medicare PPO | Source: Ambulatory Visit | Attending: Nurse Practitioner

## 2023-06-05 DIAGNOSIS — I6523 Occlusion and stenosis of bilateral carotid arteries: Secondary | ICD-10-CM | POA: Diagnosis present

## 2023-06-05 LAB — POCT I-STAT CREATININE: Creatinine, Ser: 0.7 mg/dL (ref 0.44–1.00)

## 2023-06-05 MED ORDER — IOHEXOL 350 MG/ML SOLN
75.0000 mL | Freq: Once | INTRAVENOUS | Status: AC | PRN
  Administered 2023-06-05: 75 mL via INTRAVENOUS

## 2023-06-09 ENCOUNTER — Ambulatory Visit (INDEPENDENT_AMBULATORY_CARE_PROVIDER_SITE_OTHER): Payer: Medicare PPO | Admitting: Vascular Surgery

## 2023-06-09 ENCOUNTER — Encounter (INDEPENDENT_AMBULATORY_CARE_PROVIDER_SITE_OTHER): Payer: Self-pay | Admitting: Vascular Surgery

## 2023-06-09 VITALS — BP 172/82 | HR 72 | Resp 18 | Ht 63.0 in | Wt 122.0 lb

## 2023-06-09 DIAGNOSIS — I6523 Occlusion and stenosis of bilateral carotid arteries: Secondary | ICD-10-CM | POA: Diagnosis not present

## 2023-06-09 DIAGNOSIS — E7800 Pure hypercholesterolemia, unspecified: Secondary | ICD-10-CM

## 2023-06-09 DIAGNOSIS — I6529 Occlusion and stenosis of unspecified carotid artery: Secondary | ICD-10-CM | POA: Insufficient documentation

## 2023-06-09 NOTE — Progress Notes (Signed)
MRN : [id number]  Tiffany [name] is a 72 y.o. (April 29, 1951) female who presents with chief complaint of  Chief Complaint  Patient presents with   Follow-up    ct results  .  History of Present Illness: Patient returns today in follow up of her carotid disease.  She had an outside duplex suggesting 50 to 69% ICA stenosis bilaterally.  She was referred for further evaluation and treatment.  We actually got a CT angiogram for further evaluation due to some disparate findings between what we would have interpreted this study versus what it was called by the reporting physician.  She does not have any focal neurologic symptoms of cerebrovascular ischemia. Specifically, the patient denies amaurosis fugax, speech or swallowing difficulties, or arm or leg weakness or numbness. The patient has undergone a CT angiogram which I have independently reviewed.  This has yet to be read by radiology even though it was done 4 days ago.  Clearly, her carotid arteries are clean without significant plaque or stenosis present throughout the course.  Both vertebral arteries appear to be widely patent as well.  There is no proximal or distal disease of note.  Current Outpatient Medications  Medication Sig Dispense Refill   acetaminophen (TYLENOL) 325 MG tablet Take 325 mg by mouth every 6 (six) hours as needed for mild pain.     ALPRAZolam (XANAX) 0.25 MG tablet Take 1 tablet (0.25 mg total) by mouth as needed. 30 tablet 2   amLODipine (NORVASC) 2.5 MG tablet 2.5 mg daily.     atorvastatin (LIPITOR) 10 MG tablet Take 10 mg by mouth daily.     BABY ASPIRIN PO Take 81 mg by mouth daily.     Biotin 1000 MCG tablet Take 1 tablet by mouth daily.     Calcium-Magnesium-Vitamin D (CALCIUM 1200+D3 PO) Take by mouth.     cholecalciferol (VITAMIN D3) 10 MCG (400 UNIT) TABS tablet Take 1,000 Units by mouth daily.     Cranberry 200 MG CAPS Take 2 capsules by mouth 2 (two) times daily.      cyclobenzaprine (FLEXERIL) 5 MG  tablet Take 2.5 mg by mouth at bedtime as needed for muscle spasms.     diphenhydrAMINE (BENADRYL) 25 mg capsule Take 25 mg by mouth every 6 (six) hours as needed.     estradiol (ESTRACE) 0.1 MG/GM vaginal cream Apply 1 gram per vagina three times a week 30 g 6   fexofenadine (ALLEGRA) 180 MG tablet Take 180 mg by mouth daily as needed for allergies.      ibuprofen (ADVIL) 200 MG tablet Take 200 mg by mouth every 6 (six) hours as needed for moderate pain.     Lactobacillus Acidophilus POWD Take 1 capsule by mouth 2 (two) times daily.     MAGNESIUM PO Take 1 tablet by mouth daily.     metroNIDAZOLE (METROGEL) 1 % gel Apply to face QHS. 30 g 11   Multiple Vitamin (MULTIVITAMIN) capsule Take 1 capsule by mouth daily.     naproxen sodium (ALEVE) 220 MG tablet Take 1 tablet by mouth 2 (two) times daily as needed (pain).      NONFORMULARY OR COMPOUNDED ITEM Sm 4 Azeliac Acid 15%, Metronidazole 1%, Ivermectin 1% for rosacea     omeprazole (PRILOSEC) 20 MG capsule Take 20 mg by mouth daily as needed.     phenylephrine-shark liver oil-mineral oil-petrolatum (PREPARATION H) 0.25-3-14-71.9 % rectal ointment Place 1 application rectally 2 (two) times daily as needed for  hemorrhoids.     pseudoephedrine (SUDAFED) 30 MG tablet Take 30 mg by mouth every 4 (four) hours as needed for congestion.     triamcinolone (NASACORT) 55 MCG/ACT AERO nasal inhaler Place 2 sprays into the nose daily.     No current facility-administered medications for this visit.    Past Medical History:  Diagnosis Date   Allergy    Anxiety    Basal cell carcinoma    Basal cell carcinoma (BCC) of right upper arm 04/27/2008   R deltoid    DDD (degenerative disc disease), lumbar 08/05/2018   History of dysplastic nevus 07/21/2019   R lower quad abd 11cm inf lat to umbilicus/moderate atypia    Hx of dysplastic nevus 06/21/2015   L distal lat deltoid/ mod atypia    Hx of dysplastic nevus 06/21/2015   R distal ant thigh/ severe atypa     Hx of dysplastic nevus 06/15/2014   left side above waist/ mild atypia   Hx of dysplastic nevus 06/21/2009   Left ant axilla/ slight atypia   Hypercholesterolemia 08/05/2019   Hypertension    IBS (irritable bowel syndrome)    Orthodontics    multiple dental implants   Osteopenia    Plantar fasciitis    Prediabetes    Wears contact lenses    Right eye    Past Surgical History:  Procedure Laterality Date   BREAST BIOPSY Right 03/20/2014   negative   BROW LIFT Bilateral 02/14/2022   Procedure: BLEPHAROPLASTY UPPER EYELID; WITH EXCESS SKIN, BILATERAL BLEPHAROPTOSIS REPAIR, RESECT EX BILATEAL SHAVE BIOPSY RIGHT LOWER EYELID;  Surgeon: Imagene Riches, MD;  Location: [facility];  Service: Ophthalmology;  Laterality: Bilateral;   COLONOSCOPY     COLONOSCOPY WITH PROPOFOL N/A 08/15/2020   Procedure: COLONOSCOPY WITH PROPOFOL;  Surgeon: Toledo, Boykin Nearing, MD;  Location: [facility];  Service: Gastroenterology;  Laterality: N/A;   dental implants     EYE SURGERY     FRACTURE SURGERY  01/2007   nose fracture-repair   RHINOPLASTY  2008   For fractured nose   UPPER GI ENDOSCOPY       Social History   Tobacco Use   Smoking status: Never   Smokeless tobacco: Never  Vaping Use   Vaping status: Never Used  Substance Use Topics   Alcohol use: No   Drug use: No      Family History  Problem Relation Age of Onset   Colon cancer Mother    Hypertension Mother    Basal cell carcinoma Mother    Osteoporosis Mother    Diabetes Father    Hypertension Father    Heart disease Father    Alzheimer's disease Father    Breast cancer Neg Hx     Allergies  Allergen Reactions   Prednisone Other (See Comments)    Body aches with oral prednisone   Sulfa Antibiotics Nausea Only     REVIEW OF SYSTEMS (Negative unless checked)  Constitutional: [] Weight loss  [] Fever  [] Chills Cardiac: [] Chest pain   [] Chest pressure   [] Palpitations   [] Shortness of breath when laying  flat   [] Shortness of breath at rest   [] Shortness of breath with exertion. Vascular:  [] Pain in legs with walking   [] Pain in legs at rest   [] Pain in legs when laying flat   [] Claudication   [] Pain in feet when walking  [] Pain in feet at rest  [] Pain in feet when laying flat   [] History of DVT   []   Phlebitis   [] Swelling in legs   [] Varicose veins   [] Non-healing ulcers Pulmonary:   [] Uses home oxygen   [] Productive cough   [] Hemoptysis   [] Wheeze  [] COPD   [] Asthma Neurologic:  [] Dizziness  [] Blackouts   [] Seizures   [] History of stroke   [] History of TIA  [] Aphasia   [] Temporary blindness   [] Dysphagia   [] Weakness or numbness in arms   [] Weakness or numbness in legs Musculoskeletal:  [x] Arthritis   [] Joint swelling   [x] Joint pain   [] Low back pain Hematologic:  [] Easy bruising  [] Easy bleeding   [] Hypercoagulable state   [] Anemic   Gastrointestinal:  [] Blood in stool   [] Vomiting blood  [] Gastroesophageal reflux/heartburn   [] Abdominal pain Genitourinary:  [] Chronic kidney disease   [] Difficult urination  [] Frequent urination  [] Burning with urination   [] Hematuria Skin:  [] Rashes   [] Ulcers   [] Wounds Psychological:  [x] History of anxiety   []  History of major depression.  Physical Examination  BP (!) 172/82 (BP Location: Left Arm)   Pulse 72   Resp 18   Ht 5\' 3"  (1.6 m)   Wt 122 lb (55.3 kg)   BMI 21.61 kg/m  Gen:  WD/WN, NAD. Appears younger than stated age. Head: [place]/AT, No temporalis wasting. Ear/Nose/Throat: Hearing grossly intact, nares w/o erythema or drainage Eyes: Conjunctiva clear. Sclera non-icteric Neck: Supple.  Trachea midline Pulmonary:  Good air movement, no use of accessory muscles.  Cardiac: RRR, no JVD Vascular:  Vessel Right Left  Radial Palpable Palpable               Musculoskeletal: M/S 5/5 throughout.  No deformity or atrophy. No edema. Neurologic: Sensation grossly intact in extremities.  Symmetrical.  Speech is fluent.  Psychiatric: Judgment intact,  Mood & affect appropriate for pt's clinical situation. Dermatologic: No rashes or ulcers noted.  No cellulitis or open wounds.      Labs Recent Results (from the past 2160 hour(s))  I-STAT creatinine     Status: None   Collection Time: 06/05/23 10:17 AM  Result Value Ref Range   Creatinine, Ser 0.70 0.44 - 1.00 mg/dL    Radiology No results found.  Assessment/Plan  No problem-specific Assessment & Plan notes found for this encounter.    Festus Barren, MD  06/09/2023 4:30 PM    This note was created with Dragon medical transcription system.  Any errors from dictation are purely unintentional

## 2023-06-09 NOTE — Assessment & Plan Note (Signed)
The patient has undergone a CT angiogram which I have independently reviewed.  This has yet to be read by radiology even though it was done 4 days ago.  Clearly, her carotid arteries are clean without significant plaque or stenosis present throughout the course.  Both vertebral arteries appear to be widely patent as well.  There is no proximal or distal disease of note.  She really has no appreciable atherosclerosis.  Her outside carotid duplex was likely jesting a significant degree of stenosis that is not present on her CT scan.  No further vascular workup is planned at this time.  I do not think she needs to be on any statin agent or antiplatelet therapy from my point of view.

## 2023-06-09 NOTE — Assessment & Plan Note (Signed)
lipid control important in reducing the progression of atherosclerotic disease.  Defer the use of statin agent to her primary care provider.

## 2023-06-11 ENCOUNTER — Other Ambulatory Visit: Payer: Medicare PPO

## 2023-06-16 ENCOUNTER — Ambulatory Visit
Admission: RE | Admit: 2023-06-16 | Discharge: 2023-06-16 | Disposition: A | Discharge status: Home / Self Care | Payer: Medicare PPO | Source: Ambulatory Visit | Attending: Obstetrics and Gynecology | Admitting: Obstetrics and Gynecology

## 2023-06-16 DIAGNOSIS — Z1231 Encounter for screening mammogram for malignant neoplasm of breast: Secondary | ICD-10-CM | POA: Insufficient documentation

## 2023-06-19 ENCOUNTER — Encounter (INDEPENDENT_AMBULATORY_CARE_PROVIDER_SITE_OTHER): Payer: Medicare PPO

## 2023-06-19 ENCOUNTER — Encounter (INDEPENDENT_AMBULATORY_CARE_PROVIDER_SITE_OTHER): Payer: Medicare PPO | Admitting: Vascular Surgery

## 2023-12-08 ENCOUNTER — Encounter: Payer: Self-pay | Admitting: Dermatology

## 2023-12-08 ENCOUNTER — Ambulatory Visit: Payer: Medicare PPO | Admitting: Dermatology

## 2023-12-08 DIAGNOSIS — L820 Inflamed seborrheic keratosis: Secondary | ICD-10-CM | POA: Diagnosis not present

## 2023-12-08 DIAGNOSIS — L814 Other melanin hyperpigmentation: Secondary | ICD-10-CM

## 2023-12-08 DIAGNOSIS — L578 Other skin changes due to chronic exposure to nonionizing radiation: Secondary | ICD-10-CM | POA: Diagnosis not present

## 2023-12-08 DIAGNOSIS — W908XXA Exposure to other nonionizing radiation, initial encounter: Secondary | ICD-10-CM | POA: Diagnosis not present

## 2023-12-08 DIAGNOSIS — Z86018 Personal history of other benign neoplasm: Secondary | ICD-10-CM

## 2023-12-08 DIAGNOSIS — L720 Epidermal cyst: Secondary | ICD-10-CM

## 2023-12-08 DIAGNOSIS — Z79899 Other long term (current) drug therapy: Secondary | ICD-10-CM

## 2023-12-08 DIAGNOSIS — E881 Lipodystrophy, not elsewhere classified: Secondary | ICD-10-CM

## 2023-12-08 DIAGNOSIS — L719 Rosacea, unspecified: Secondary | ICD-10-CM

## 2023-12-08 DIAGNOSIS — Z7189 Other specified counseling: Secondary | ICD-10-CM

## 2023-12-08 DIAGNOSIS — Z1283 Encounter for screening for malignant neoplasm of skin: Secondary | ICD-10-CM

## 2023-12-08 DIAGNOSIS — D229 Melanocytic nevi, unspecified: Secondary | ICD-10-CM

## 2023-12-08 DIAGNOSIS — Z85828 Personal history of other malignant neoplasm of skin: Secondary | ICD-10-CM

## 2023-12-08 DIAGNOSIS — D1801 Hemangioma of skin and subcutaneous tissue: Secondary | ICD-10-CM

## 2023-12-08 DIAGNOSIS — L821 Other seborrheic keratosis: Secondary | ICD-10-CM

## 2023-12-08 NOTE — Progress Notes (Signed)
 Follow-Up Visit   Subjective  Tiffany [name] is a 73 y.o. female who presents for the following: Skin Cancer Screening and Full Body Skin Exam The patient presents for Total-Body Skin Exam (TBSE) for skin cancer screening and mole check. The patient has spots, moles and lesions to be evaluated, some may be new or changing and the patient may have concern these could be cancer.  The following portions of the chart were reviewed this encounter and updated as appropriate: medications, allergies, medical history  Review of Systems:  No other skin or systemic complaints except as noted in HPI or Assessment and Plan.  Objective  Well appearing patient in no apparent distress; mood and affect are within normal limits.  A full examination was performed including scalp, head, eyes, ears, nose, lips, neck, chest, axillae, abdomen, back, buttocks, bilateral upper extremities, bilateral lower extremities, hands, feet, fingers, toes, fingernails, and toenails. All findings within normal limits unless otherwise noted below.   Relevant physical exam findings are noted in the Assessment and Plan.  inframammary x 16 (16) Stuck-on, waxy, tan-brown papules and plaques -- Discussed benign etiology and prognosis.      Assessment & Plan   SKIN CANCER SCREENING PERFORMED TODAY.  ACTINIC DAMAGE - Chronic condition, secondary to cumulative UV/sun exposure - diffuse scaly erythematous macules with underlying dyspigmentation - Recommend daily broad spectrum sunscreen SPF 30+ to sun-exposed areas, reapply every 2 hours as needed.  - Staying in the shade or wearing long sleeves, sun glasses (UVA+UVB protection) and wide brim hats (4-inch brim around the entire circumference of the hat) are also recommended for sun protection.  - Call for new or changing lesions.  LENTIGINES, SEBORRHEIC KERATOSES, HEMANGIOMAS - Benign normal skin lesions - Benign-appearing - Call for any changes  MELANOCYTIC NEVI -  Tan-brown and/or pink-flesh-colored symmetric macules and papules - Benign appearing on exam today - Observation - Call clinic for new or changing moles - Recommend daily use of broad spectrum spf 30+ sunscreen to sun-exposed areas.   CONGENTIAL NEVUS Left anterior thigh- see photo  Brown macule  No changes per patient  Observe    Milia Left forehead  - tiny firm white papules - type of cyst - benign - sometimes these will clear with nightly OTC adapalene/Differin 0.1% gel or retinol. - may be extracted if symptomatic - observe  Sample of Arazlo and Aklief cream given  spot treat a tiny film once a day, use one cream at a time to see which cream she like better  HISTORY OF DYSPLASTIC NEVUS No evidence of recurrence today Recommend regular full body skin exams Recommend daily broad spectrum sunscreen SPF 30+ to sun-exposed areas, reapply every 2 hours as needed.  Call if any new or changing lesions are noted between office visits   HISTORY OF BASAL CELL CARCINOMA OF THE SKIN - No evidence of recurrence today - Recommend regular full body skin exams - Recommend daily broad spectrum sunscreen SPF 30+ to sun-exposed areas, reapply every 2 hours as needed.  - Call if any new or changing lesions are noted between office visits   FAT ATROPHY  Left buttock from steroid injection for sciatica   Rosacea Face Rosacea is a chronic progressive skin condition usually affecting the face of adults, causing redness and/or acne bumps. It is treatable but not curable. It sometimes affects the eyes (ocular rosacea) as well. It may respond to topical and/or systemic medication and can flare with stress, sun exposure, alcohol, exercise, topical  steroids (including hydrocortisone/cortisone 10) and some foods.  Daily application of broad spectrum spf 30+ sunscreen to face is recommended to reduce flares.    Skin Medicinals mix QHS PRN.   INFLAMED SEBORRHEIC KERATOSIS (16) inframammary x 16  (16) Symptomatic, irritating, patient would like treated.  Destruction of lesion - inframammary x 16 (16) Complexity: simple   Destruction method: cryotherapy   Informed consent: discussed and consent obtained   Timeout:  patient name, date of birth, surgical site, and procedure verified Lesion destroyed using liquid nitrogen: Yes   Region frozen until ice ball extended beyond lesion: Yes   Outcome: patient tolerated procedure well with no complications   Post-procedure details: wound care instructions given   Additional details:  Prior to procedure, discussed risks of blister formation, small wound, skin dyspigmentation, or rare scar following cryotherapy. Recommend Vaseline ointment to treated areas while healing.  ACTINIC SKIN DAMAGE   LENTIGO   MELANOCYTIC NEVUS, UNSPECIFIED LOCATION   SEBORRHEIC KERATOSIS   HISTORY OF DYSPLASTIC NEVUS   HISTORY OF BASAL CELL CARCINOMA   ROSACEA   Related Medications metroNIDAZOLE (METROGEL) 1 % gel Apply to face QHS. COUNSELING AND COORDINATION OF CARE   MEDICATION MANAGEMENT     Return in about 1 year (around 12/07/2024) for TBSE, hx of BCC, hx of Dysplastic nevus .  IAngelique Holm, CMA, am acting as scribe for Armida Sans, MD .   Documentation: I have reviewed the above documentation for accuracy and completeness, and I agree with the above.  Armida Sans, MD

## 2023-12-08 NOTE — Patient Instructions (Addendum)

## 2024-02-09 ENCOUNTER — Encounter: Payer: Self-pay | Admitting: Internal Medicine

## 2024-02-09 ENCOUNTER — Ambulatory Visit: Admitting: General Practice

## 2024-02-09 ENCOUNTER — Ambulatory Visit
Admission: RE | Admit: 2024-02-09 | Discharge: 2024-02-09 | Disposition: A | Discharge status: Home / Self Care | Attending: Internal Medicine | Admitting: Internal Medicine

## 2024-02-09 ENCOUNTER — Other Ambulatory Visit: Payer: Self-pay

## 2024-02-09 ENCOUNTER — Encounter
Admission: RE | Disposition: A | Discharge status: Home / Self Care | Payer: Self-pay | Source: Home / Self Care | Attending: Internal Medicine

## 2024-02-09 DIAGNOSIS — I10 Essential (primary) hypertension: Secondary | ICD-10-CM | POA: Diagnosis not present

## 2024-02-09 DIAGNOSIS — Z1211 Encounter for screening for malignant neoplasm of colon: Secondary | ICD-10-CM | POA: Insufficient documentation

## 2024-02-09 DIAGNOSIS — Z800 Family history of malignant neoplasm of digestive organs: Secondary | ICD-10-CM | POA: Diagnosis present

## 2024-02-09 DIAGNOSIS — R194 Change in bowel habit: Secondary | ICD-10-CM | POA: Diagnosis not present

## 2024-02-09 DIAGNOSIS — F419 Anxiety disorder, unspecified: Secondary | ICD-10-CM | POA: Insufficient documentation

## 2024-02-09 DIAGNOSIS — K219 Gastro-esophageal reflux disease without esophagitis: Secondary | ICD-10-CM | POA: Diagnosis not present

## 2024-02-09 HISTORY — PX: COLONOSCOPY: SHX5424

## 2024-02-09 SURGERY — COLONOSCOPY
Anesthesia: General

## 2024-02-09 MED ORDER — DEXMEDETOMIDINE HCL IN NACL 80 MCG/20ML IV SOLN
INTRAVENOUS | Status: DC | PRN
  Administered 2024-02-09: 12 ug via INTRAVENOUS
  Administered 2024-02-09 (×2): 4 ug via INTRAVENOUS

## 2024-02-09 MED ORDER — PROPOFOL 1000 MG/100ML IV EMUL
INTRAVENOUS | Status: AC
  Filled 2024-02-09: qty 100

## 2024-02-09 MED ORDER — PROPOFOL 500 MG/50ML IV EMUL
INTRAVENOUS | Status: DC | PRN
  Administered 2024-02-09: 50 ug/kg/min via INTRAVENOUS

## 2024-02-09 MED ORDER — LIDOCAINE HCL (CARDIAC) PF 100 MG/5ML IV SOSY
PREFILLED_SYRINGE | INTRAVENOUS | Status: DC | PRN
  Administered 2024-02-09: 60 mg via INTRAVENOUS

## 2024-02-09 MED ORDER — PROPOFOL 10 MG/ML IV BOLUS
INTRAVENOUS | Status: DC | PRN
  Administered 2024-02-09: 30 mg via INTRAVENOUS
  Administered 2024-02-09: 50 mg via INTRAVENOUS
  Administered 2024-02-09: 20 mg via INTRAVENOUS

## 2024-02-09 MED ORDER — SODIUM CHLORIDE 0.9 % IV SOLN: INTRAVENOUS | Status: DC

## 2024-02-09 NOTE — Op Note (Signed)
 [facility] Gastroenterology Patient Name: Tiffany [name] Procedure Date: 02/09/2024 12:13 PM MRN: [id number] Account #: [id number] Date of Birth: 1951-09-17 Admit Type: Outpatient Age: 73 Room: [facility] ENDO ROOM 1 Gender: Female Note Status: Supervisor Override Instrument Name: Charlyn Cooley [id number] Procedure:             Colonoscopy Indications:           Screening in patient at increased risk: Family history                         of 1st-degree relative with colorectal cancer,                         Incidental change in bowel habits noted Providers:             Pairlee Sawtell K. Lisbeth Puller MD, MD Medicines:             Propofol  per Anesthesia Complications:         No immediate complications. Procedure:             Pre-Anesthesia Assessment:                        - The risks and benefits of the procedure and the                         sedation options and risks were discussed with the                         patient. All questions were answered and informed                         consent was obtained.                        - Patient identification and proposed procedure were                         verified prior to the procedure by the nurse. The                         procedure was verified in the procedure room.                        - ASA Grade Assessment: III - A patient with severe                         systemic disease.                        - After reviewing the risks and benefits, the patient                         was deemed in satisfactory condition to undergo the                         procedure.                        After obtaining informed consent, the colonoscope was  passed under direct vision. Throughout the procedure,                         the patient's blood pressure, pulse, and oxygen                         saturations were monitored continuously. The                         Colonoscope was introduced through the  anus and                         advanced to the the cecum, identified by appendiceal                         orifice and ileocecal valve. The patient tolerated the                         procedure well. The quality of the bowel preparation                         was good. The ileocecal valve, appendiceal orifice,                         and rectum were photographed. The colonoscopy was                         somewhat difficult due to a redundant colon and                         significant looping. Successful completion of the                         procedure was aided by applying abdominal pressure. Findings:      The perianal and digital rectal examinations were normal. Pertinent       negatives include normal sphincter tone and no palpable rectal lesions.      Normal mucosa was found in the entire colon.      The retroflexed view of the distal rectum and anal verge was normal and       showed no anal or rectal abnormalities. Impression:            - Normal mucosa in the entire examined colon.                        - The distal rectum and anal verge are normal on                         retroflexion view.                        - No specimens collected. Recommendation:        - Patient has a contact number available for                         emergencies. The signs and symptoms of potential  delayed complications were discussed with the patient.                         Return to normal activities tomorrow. Written                         discharge instructions were provided to the patient.                        - Resume previous diet.                        - Continue present medications.                        - Repeat colonoscopy in 5 years for screening purposes.                        - Follow up with Corrinne Din, GI Nurse                         Practioner, in office to discuss results and monitor                         progress.                         - Telephone GI office to schedule appointment in 6                         months.                        - The findings and recommendations were discussed with                         the patient. Procedure Code(s):     --- Professional ---                        [phone], Colonoscopy, flexible; diagnostic, including                         collection of specimen(s) by brushing or washing, when                         performed (separate procedure) CPT copyright 2022 [employer]. All rights reserved. The codes documented in this report are preliminary and upon coder review may  be revised to meet current compliance requirements. Cassie Click MD, MD 02/09/2024 1:07:59 PM This report has been signed electronically. Number of Addenda: 0 Note Initiated On: 02/09/2024 12:13 PM Scope Withdrawal Time: 0 hours 5 minutes 58 seconds  Total Procedure Duration: 0 hours 13 minutes 24 seconds  Estimated Blood Loss:  Estimated blood loss: none.      [facility]

## 2024-02-09 NOTE — Transfer of Care (Signed)
 Immediate Anesthesia Transfer of Care Note  Patient: Tiffany [name]  Procedure(s) Performed: COLONOSCOPY  Patient Location: PACU  Anesthesia Type:General  Level of Consciousness: sedated  Airway & Oxygen Therapy: Patient Spontanous Breathing  Post-op Assessment: Report given to RN and Post -op Vital signs reviewed and stable  Post vital signs: Reviewed and stable  Last Vitals:  Vitals Value Taken Time  BP 95/54 02/09/24 1307  Temp 35.9 C 02/09/24 1307  Pulse 62 02/09/24 1307  Resp 16 02/09/24 1307  SpO2 99 % 02/09/24 1307    Last Pain:  Vitals:   02/09/24 1307  TempSrc: Temporal  PainSc: Asleep         Complications: No notable events documented.

## 2024-02-09 NOTE — Anesthesia Preprocedure Evaluation (Signed)
 Anesthesia Evaluation  Patient identified by MRN, date of birth, ID band Patient awake    Reviewed: Allergy & Precautions, H&P , NPO status , Patient's Chart, lab work & pertinent test results  Airway Mallampati: II  TM Distance: >3 FB Neck ROM: full    Dental no notable dental hx. (+) Chipped   Pulmonary neg pulmonary ROS   Pulmonary exam normal breath sounds clear to auscultation       Cardiovascular hypertension, negative cardio ROS Normal cardiovascular exam Rhythm:regular Rate:Normal     Neuro/Psych   Anxiety     negative neurological ROS  negative psych ROS   GI/Hepatic negative GI ROS, Neg liver ROS,GERD  ,,  Endo/Other  negative endocrine ROS    Renal/GU negative Renal ROS  negative genitourinary   Musculoskeletal   Abdominal   Peds  Hematology negative hematology ROS (+)   Anesthesia Other Findings Past Medical History: No date: Allergy No date: Anxiety No date: Basal cell carcinoma 04/27/2008: Basal cell carcinoma (BCC) of right upper arm     Comment:  R deltoid  08/05/2018: DDD (degenerative disc disease), lumbar 07/21/2019: History of dysplastic nevus     Comment:  R lower quad abd 11cm inf lat to umbilicus/moderate               atypia  06/21/2015: Hx of dysplastic nevus     Comment:  L distal lat deltoid/ mod atypia  06/21/2015: Hx of dysplastic nevus     Comment:  R distal ant thigh/ severe atypa  06/15/2014: Hx of dysplastic nevus     Comment:  left side above waist/ mild atypia 06/21/2009: Hx of dysplastic nevus     Comment:  Left ant axilla/ slight atypia 08/05/2019: Hypercholesterolemia No date: Hypertension No date: IBS (irritable bowel syndrome) No date: Orthodontics     Comment:  multiple dental implants No date: Osteopenia No date: Plantar fasciitis No date: Prediabetes No date: Wears contact lenses     Comment:  Right eye  Past Surgical History: 03/20/2014: BREAST BIOPSY;  Right     Comment:  negative 02/14/2022: BROW LIFT; Bilateral     Comment:  Procedure: BLEPHAROPLASTY UPPER EYELID; WITH EXCESS               SKIN, BILATERAL BLEPHAROPTOSIS REPAIR, RESECT EX BILATEAL              SHAVE BIOPSY RIGHT LOWER EYELID;  Surgeon: Zacarias Hermann,              MD;  Location: [facility];  Service:               Ophthalmology;  Laterality: Bilateral; No date: COLONOSCOPY 08/15/2020: COLONOSCOPY WITH PROPOFOL ; N/A     Comment:  Procedure: COLONOSCOPY WITH PROPOFOL ;  Surgeon: [place],               Alphonsus Jeans, MD;  Location: [facility] ENDOSCOPY;  Service:               Gastroenterology;  Laterality: N/A; No date: dental implants No date: EYE SURGERY 01/2007: FRACTURE SURGERY     Comment:  nose fracture-repair 2008: RHINOPLASTY     Comment:  For fractured nose No date: UPPER GI ENDOSCOPY  BMI    Body Mass Index: 22.65 kg/m      Reproductive/Obstetrics negative OB ROS  Anesthesia Physical Anesthesia Plan  ASA: 2  Anesthesia Plan: General   Post-op Pain Management: Minimal or no pain anticipated   Induction: Intravenous  PONV Risk Score and Plan: 3 and Propofol  infusion, TIVA and Ondansetron  Airway Management Planned: Nasal Cannula  Additional Equipment: None  Intra-op Plan:   Post-operative Plan:   Informed Consent: I have reviewed the patients History and Physical, chart, labs and discussed the procedure including the risks, benefits and alternatives for the proposed anesthesia with the patient or authorized representative who has indicated his/her understanding and acceptance.     Dental advisory given  Plan Discussed with: CRNA and Surgeon  Anesthesia Plan Comments: (Discussed risks of anesthesia with patient, including possibility of difficulty with spontaneous ventilation under anesthesia necessitating airway intervention, PONV, and rare risks such as cardiac or respiratory or  neurological events, and allergic reactions. Discussed the role of CRNA in patient's perioperative care. Patient understands.)       Anesthesia Quick Evaluation

## 2024-02-09 NOTE — Interval H&P Note (Signed)
 History and Physical Interval Note:  02/09/2024 11:44 AM  Tiffany [name]  has presented today for surgery, with the diagnosis of Bowel habit changes (R19.4).  The various methods of treatment have been discussed with the patient and family. After consideration of risks, benefits and other options for treatment, the patient has consented to  Procedure(s): COLONOSCOPY (N/A) as a surgical intervention.  The patient's history has been reviewed, patient examined, no change in status, stable for surgery.  I have reviewed the patient's chart and labs.  Questions were answered to the patient's satisfaction.     [place], Kiah Keay

## 2024-02-09 NOTE — Anesthesia Postprocedure Evaluation (Signed)
 Anesthesia Post Note  Patient: Tiffany [name]  Procedure(s) Performed: COLONOSCOPY  Patient location during evaluation: PACU Anesthesia Type: General Level of consciousness: awake and alert, oriented and patient cooperative Pain management: pain level controlled Vital Signs Assessment: post-procedure vital signs reviewed and stable Respiratory status: spontaneous breathing, nonlabored ventilation and respiratory function stable Cardiovascular status: blood pressure returned to baseline and stable Postop Assessment: adequate PO intake Anesthetic complications: no   No notable events documented.   Last Vitals:  Vitals:   02/09/24 1307 02/09/24 1317  BP: (!) 95/54 (!) 89/55  Pulse: 62 63  Resp: 16 13  Temp: (!) 35.9 C   SpO2: 99% 99%    Last Pain:  Vitals:   02/09/24 1317  TempSrc:   PainSc: 0-No pain                 Dorothey Gate

## 2024-02-09 NOTE — H&P (Signed)
 Outpatient short stay form Pre-procedure 02/09/2024 11:35 AM Keiri Solano K. Corky Diener, M.D.  Primary Physician: Emi Hanson, M.D.  Reason for visit:  Change in bowel habits  History of present illness:  Ms. Hazelrigg was last seen on 07/20/20 to arrange screening colonoscopy due to family history of colon cancer in her mother, done as above  Since the last visit, states she is generally well- has noted some intermittent bowel habit changes and some intermittent fecal staining with mucousy stuff for the last year so. Moves bowels most every day- does say she has starts with a formed stool and sometimes will have some looser stools later in the morning. She is concerned about the [facility] material as this is new. Has had some pink tinges to it at times. Has had some intermittent bloating. Denies abdominal pain. Denies any bloody stools/melena. Has had some mild stress urinary incontninence. Denies any upper GI symptoms Cbc, ferritin, b12 normal 12/24. Hemoccult cards negative 1/25 No known problems with sedated procedures.    No current facility-administered medications for this encounter.  Medications Prior to Admission  Medication Sig Dispense Refill Last Dose/Taking   acetaminophen  (TYLENOL ) 325 MG tablet Take 325 mg by mouth every 6 (six) hours as needed for mild pain.      ALPRAZolam  (XANAX ) 0.25 MG tablet Take 1 tablet (0.25 mg total) by mouth as needed. 30 tablet 2    amLODipine (NORVASC) 2.5 MG tablet 2.5 mg daily.      atorvastatin (LIPITOR) 10 MG tablet Take 10 mg by mouth daily.      BABY ASPIRIN PO Take 81 mg by mouth daily.      Biotin 1000 MCG tablet Take 1 tablet by mouth daily.      Calcium-Magnesium-Vitamin D (CALCIUM 1200+D3 PO) Take by mouth.      cholecalciferol (VITAMIN D3) 10 MCG (400 UNIT) TABS tablet Take 1,000 Units by mouth daily.      Cranberry 200 MG CAPS Take 2 capsules by mouth 2 (two) times daily.       cyclobenzaprine (FLEXERIL) 5 MG tablet Take 2.5 mg by mouth at  bedtime as needed for muscle spasms.      diphenhydrAMINE (BENADRYL) 25 mg capsule Take 25 mg by mouth every 6 (six) hours as needed.      estradiol  (ESTRACE ) 0.1 MG/GM vaginal cream Apply 1 gram per vagina three times a week 30 g 6    fexofenadine (ALLEGRA) 180 MG tablet Take 180 mg by mouth daily as needed for allergies.       ibuprofen (ADVIL) 200 MG tablet Take 200 mg by mouth every 6 (six) hours as needed for moderate pain.      Lactobacillus Acidophilus POWD Take 1 capsule by mouth 2 (two) times daily.      MAGNESIUM PO Take 1 tablet by mouth daily.      metroNIDAZOLE  (METROGEL ) 1 % gel Apply to face QHS. 30 g 11    Multiple Vitamin (MULTIVITAMIN) capsule Take 1 capsule by mouth daily.      naproxen sodium (ALEVE) 220 MG tablet Take 1 tablet by mouth 2 (two) times daily as needed (pain).       NONFORMULARY OR COMPOUNDED ITEM Sm 4 Azeliac Acid 15%, Metronidazole  1%, Ivermectin 1% for rosacea      omeprazole (PRILOSEC) 20 MG capsule Take 20 mg by mouth daily as needed.      phenylephrine-shark liver oil-mineral oil-petrolatum (PREPARATION H) 0.25-3-14-71.9 % rectal ointment Place 1 application rectally 2 (two) times daily  as needed for hemorrhoids.      pseudoephedrine (SUDAFED) 30 MG tablet Take 30 mg by mouth every 4 (four) hours as needed for congestion.      triamcinolone (NASACORT) 55 MCG/ACT AERO nasal inhaler Place 2 sprays into the nose daily.        Allergies  Allergen Reactions   Prednisone Other (See Comments)    Body aches with oral prednisone   Sulfa Antibiotics Nausea Only     Past Medical History:  Diagnosis Date   Allergy    Anxiety    Basal cell carcinoma    Basal cell carcinoma (BCC) of right upper arm 04/27/2008   R deltoid    DDD (degenerative disc disease), lumbar 08/05/2018   History of dysplastic nevus 07/21/2019   R lower quad abd 11cm inf lat to umbilicus/moderate atypia    Hx of dysplastic nevus 06/21/2015   L distal lat deltoid/ mod atypia    Hx of  dysplastic nevus 06/21/2015   R distal ant thigh/ severe atypa    Hx of dysplastic nevus 06/15/2014   left side above waist/ mild atypia   Hx of dysplastic nevus 06/21/2009   Left ant axilla/ slight atypia   Hypercholesterolemia 08/05/2019   Hypertension    IBS (irritable bowel syndrome)    Orthodontics    multiple dental implants   Osteopenia    Plantar fasciitis    Prediabetes    Wears contact lenses    Right eye    Review of systems:  Otherwise negative.    Physical Exam  Gen: Alert, oriented. Appears stated age.  HEENT: [place]/AT. PERRLA. Lungs: CTA, no wheezes. CV: RR nl S1, S2. Abd: soft, benign, no masses. BS+ Ext: No edema. Pulses 2+    Planned procedures: Proceed with colonoscopy. The patient understands the nature of the planned procedure, indications, risks, alternatives and potential complications including but not limited to bleeding, infection, perforation, damage to internal organs and possible oversedation/side effects from anesthesia. The patient agrees and gives consent to proceed.  Please refer to procedure notes for findings, recommendations and patient disposition/instructions.     Joshaua Epple K. Corky Diener, M.D. Gastroenterology 02/09/2024  11:35 AM

## 2024-02-16 ENCOUNTER — Encounter (INDEPENDENT_AMBULATORY_CARE_PROVIDER_SITE_OTHER): Payer: Self-pay

## 2024-05-23 ENCOUNTER — Other Ambulatory Visit: Payer: Self-pay

## 2024-05-23 ENCOUNTER — Encounter: Payer: Self-pay | Admitting: Infectious Diseases

## 2024-05-23 ENCOUNTER — Telehealth: Payer: Self-pay | Admitting: Internal Medicine

## 2024-05-23 DIAGNOSIS — Z1231 Encounter for screening mammogram for malignant neoplasm of breast: Secondary | ICD-10-CM

## 2024-05-23 NOTE — Telephone Encounter (Signed)
 Patient would like the order for the Mammo to go in now,so she can schedule on the same day. Please contact patient if this can be done.  Tiffany [name]

## 2024-06-03 ENCOUNTER — Other Ambulatory Visit: Payer: Self-pay | Admitting: Family Medicine

## 2024-06-03 DIAGNOSIS — M5412 Radiculopathy, cervical region: Secondary | ICD-10-CM

## 2024-06-04 ENCOUNTER — Ambulatory Visit
Admission: RE | Admit: 2024-06-04 | Discharge: 2024-06-04 | Disposition: A | Discharge status: Home / Self Care | Source: Ambulatory Visit | Attending: Family Medicine | Admitting: Family Medicine

## 2024-06-04 DIAGNOSIS — M5412 Radiculopathy, cervical region: Secondary | ICD-10-CM

## 2024-06-08 NOTE — Progress Notes (Signed)
 ANNUAL PREVENTATIVE CARE GYNECOLOGY  ENCOUNTER NOTE  SUBJECTIVE:       Tiffany [name] is a 73 y.o. G0P0000 female here for a routine annual gynecologic exam. The patient is not sexually active. The patient is taking vaginal (topical) hormone replacement therapy. Patient denies post-menopausal vaginal bleeding. Family history of breast, uterine, ovarian cancer: no. The patient wears seatbelts: yes. The patient participates in regular exercise: yes. Has the patient ever been transfused or tattooed?: no. The patient reports that there is not domestic violence in her life. Has the patient completed the Gardasil vaccine? no.  Current complaints: 1.  Estrace : uses topically, not internally; needs refills   Gynecologic History No LMP recorded. Patient is postmenopausal. Contraception: postmenopausal Last Pap: 05/06/2021. Results were: normal History of abnormal pap: none History of STIs: none Last Mammogram: 06/16/2023. Results were: normal Last Colonoscopy: 02/09/2024 Last Dexa Scan: 08/04/2018  PHQ-2:     06/14/2024    1:16 PM 05/22/2023    8:11 AM  Depression screen PHQ 2/9  Decreased Interest 0 0  Down, Depressed, Hopeless 0 0  PHQ - 2 Score 0 0    Obstetric History OB History  Gravida Para Term Preterm AB Living  0 0 0 0 0 0  SAB IAB Ectopic Multiple Live Births  0 0 0 0 0    Past Medical History:  Diagnosis Date   Allergy    Anxiety    Basal cell carcinoma    Basal cell carcinoma (BCC) of right upper arm 04/27/2008   R deltoid    DDD (degenerative disc disease), lumbar 08/05/2018   History of dysplastic nevus 07/21/2019   R lower quad abd 11cm inf lat to umbilicus/moderate atypia    Hx of dysplastic nevus 06/21/2015   L distal lat deltoid/ mod atypia    Hx of dysplastic nevus 06/21/2015   R distal ant thigh/ severe atypa    Hx of dysplastic nevus 06/15/2014   left side above waist/ mild atypia   Hx of dysplastic nevus 06/21/2009   Left ant axilla/ slight  atypia   Hypercholesterolemia 08/05/2019   Hypertension    IBS (irritable bowel syndrome)    Orthodontics    multiple dental implants   Osteopenia    Plantar fasciitis    Prediabetes    Wears contact lenses    Right eye    Family History  Problem Relation Age of Onset   Colon cancer Mother    Hypertension Mother    Basal cell carcinoma Mother    Osteoporosis Mother    Diabetes Father    Hypertension Father    Heart disease Father    Alzheimer's disease Father    Breast cancer Neg Hx     Past Surgical History:  Procedure Laterality Date   BREAST BIOPSY Right 03/20/2014   negative   BROW LIFT Bilateral 02/14/2022   Procedure: BLEPHAROPLASTY UPPER EYELID; WITH EXCESS SKIN, BILATERAL BLEPHAROPTOSIS REPAIR, RESECT EX BILATEAL SHAVE BIOPSY RIGHT LOWER EYELID;  Surgeon: Ashley Greig HERO, MD;  [facility];  Service: Ophthalmology;  Laterality: Bilateral;   COLONOSCOPY     COLONOSCOPY N/A 02/09/2024   Procedure: COLONOSCOPY;  Surgeon: Toledo, Ladell POUR, MD;  Location: [facility];  Service: Gastroenterology;  Laterality: N/A;   COLONOSCOPY WITH PROPOFOL  N/A 08/15/2020   Procedure: COLONOSCOPY WITH PROPOFOL ;  Surgeon: Toledo, Ladell POUR, MD;  Location: [facility];  Service: Gastroenterology;  Laterality: N/A;   dental implants     EYE SURGERY  FRACTURE SURGERY  01/2007   nose fracture-repair   RHINOPLASTY  2008   For fractured nose   UPPER GI ENDOSCOPY      Social History   Socioeconomic History   Marital status: Single    Spouse name: Not on file   Number of children: Not on file   Years of education: Not on file   Highest education level: Not on file  Occupational History   Not on file  Tobacco Use   Smoking status: Never   Smokeless tobacco: Never  Vaping Use   Vaping status: Never Used  Substance and Sexual Activity   Alcohol use: No   Drug use: No   Sexual activity: Not Currently    Birth control/protection: Post-menopausal  Other  Topics Concern   Not on file  Social History Narrative   Not on file   Social Drivers of Health   Financial Resource Strain: Low Risk  (12/22/2023)   Received from [facility]   Overall Financial Resource Strain (CARDIA)    Difficulty of Paying Living Expenses: Not very hard  Food Insecurity: No Food Insecurity (12/22/2023)   Received from [facility]   Hunger Vital Sign    Within the past 12 months, you worried that your food would run out before you got the money to buy more.: Never true    Within the past 12 months, the food you bought just didn't last and you didn't have money to get more.: Never true  Transportation Needs: No Transportation Needs (12/22/2023)   Received from [facility] - Transportation    In the past 12 months, has lack of transportation kept you from medical appointments or from getting medications?: No    Lack of Transportation (Non-Medical): No  Physical Activity: Not on file  Stress: Not on file  Social Connections: Unknown (07/09/2022)   Received from [facility]   Social Network    Social Network: Not on file  Intimate Partner Violence: Unknown (07/09/2022)   Received from [facility]   HITS    Physically Hurt: Not on file    Insult or Talk Down To: Not on file    Threaten Physical Harm: Not on file    Scream or Curse: Not on file    Current Outpatient Medications on File Prior to Visit  Medication Sig Dispense Refill   acetaminophen  (TYLENOL ) 325 MG tablet Take 325 mg by mouth every 6 (six) hours as needed for mild pain.     ALPRAZolam  (XANAX ) 0.25 MG tablet Take 1 tablet (0.25 mg total) by mouth as needed. 30 tablet 2   amLODipine (NORVASC) 2.5 MG tablet 2.5 mg daily.     atorvastatin (LIPITOR) 10 MG tablet Take 10 mg by mouth daily.     Biotin 1000 MCG tablet Take 1 tablet by mouth daily.     Calcium-Magnesium-Vitamin D (CALCIUM 1200+D3 PO) Take by mouth.     cholecalciferol  (VITAMIN D3) 10 MCG (400 UNIT) TABS tablet Take 1,000 Units by mouth daily.     Cranberry 200 MG CAPS Take 2 capsules by mouth 2 (two) times daily.      cyclobenzaprine (FLEXERIL) 5 MG tablet Take 2.5 mg by mouth at bedtime as needed for muscle spasms.     diphenhydrAMINE (BENADRYL) 25 mg capsule Take 25 mg by mouth every 6 (six) hours as needed.     estradiol  (ESTRACE ) 0.1 MG/GM vaginal cream Apply 1 gram per vagina three  times a week 30 g 6   fexofenadine (ALLEGRA) 180 MG tablet Take 180 mg by mouth daily as needed for allergies.      ibuprofen (ADVIL) 200 MG tablet Take 200 mg by mouth every 6 (six) hours as needed for moderate pain.     Lactobacillus Acidophilus POWD Take 1 capsule by mouth 2 (two) times daily.     MAGNESIUM PO Take 1 tablet by mouth daily.     metroNIDAZOLE  (METROGEL ) 1 % gel Apply to face QHS. 30 g 11   Multiple Vitamin (MULTIVITAMIN) capsule Take 1 capsule by mouth daily.     naproxen sodium (ALEVE) 220 MG tablet Take 1 tablet by mouth 2 (two) times daily as needed (pain).      omeprazole (PRILOSEC) 20 MG capsule Take 20 mg by mouth daily as needed.     phenylephrine-shark liver oil-mineral oil-petrolatum (PREPARATION H) 0.25-3-14-71.9 % rectal ointment Place 1 application rectally 2 (two) times daily as needed for hemorrhoids.     pseudoephedrine (SUDAFED) 30 MG tablet Take 30 mg by mouth every 4 (four) hours as needed for congestion.     triamcinolone (NASACORT) 55 MCG/ACT AERO nasal inhaler Place 2 sprays into the nose daily.     BABY ASPIRIN PO Take 81 mg by mouth daily. (Patient not taking: Reported on 02/09/2024)     NONFORMULARY OR COMPOUNDED ITEM Sm 4 Azeliac Acid 15%, Metronidazole  1%, Ivermectin 1% for rosacea     No current facility-administered medications on file prior to visit.    Allergies  Allergen Reactions   Prednisone Other (See Comments)    Body aches with oral prednisone   Sulfa Antibiotics Nausea Only     Review of Systems ROS Review of  Systems - General ROS: negative for - chills, fatigue, fever, hot flashes, night sweats, weight gain or weight loss Psychological ROS: negative for - anxiety, decreased libido, depression, mood swings, physical abuse or sexual abuse Ophthalmic ROS: negative for - blurry vision, eye pain or loss of vision ENT ROS: negative for - headaches, hearing change, visual changes or vocal changes Allergy and Immunology ROS: negative for - hives, itchy/watery eyes or seasonal allergies Hematological and Lymphatic ROS: negative for - bleeding problems, bruising, swollen lymph nodes or weight loss Endocrine ROS: negative for - galactorrhea, hair pattern changes, hot flashes, malaise/lethargy, mood swings, palpitations, polydipsia/polyuria, skin changes, temperature intolerance or unexpected weight changes Breast ROS: negative for - new or changing breast lumps or nipple discharge Respiratory ROS: negative for - cough or shortness of breath Cardiovascular ROS: negative for - chest pain, irregular heartbeat, palpitations or shortness of breath Gastrointestinal ROS: no abdominal pain, change in bowel habits, or black or bloody stools Genito-Urinary ROS: no dysuria, trouble voiding, or hematuria Musculoskeletal ROS: negative for - joint pain or joint stiffness Neurological ROS: negative for - bowel and bladder control changes Dermatological ROS: negative for rash and skin lesion changes   OBJECTIVE:   BP (!) 163/80   Pulse 67   Ht 5' (1.524 m)   Wt 121 lb 12.8 oz (55.2 kg)   BMI 23.79 kg/m   CONSTITUTIONAL: Well-developed, well-nourished female in no acute distress.  PSYCHIATRIC: Normal mood and affect. Normal behavior. Normal judgment and thought content. NEUROLGIC: Alert and oriented to person, place, and time. Normal muscle tone coordination. No cranial nerve deficit noted. HENT:  Normocephalic, atraumatic, External right and left ear normal. Oropharynx is clear and moist EYES: Conjunctivae and EOM are  normal. No scleral icterus.  NECK:  Normal range of motion, supple, no masses.  Normal thyroid.  SKIN: Skin is warm and dry. No rash noted. Not diaphoretic. No erythema. No pallor. CARDIOVASCULAR: Normal heart rate noted, regular rhythm, no murmur. RESPIRATORY: Clear to auscultation bilaterally. Effort and breath sounds normal, no problems with respiration noted. BREASTS: Symmetric in size. No masses, skin changes, nipple drainage, or lymphadenopathy. ABDOMEN: Soft, normal bowel sounds, no distention noted.  No tenderness, rebound or guarding.  PELVIC:  Bladder no bladder distension noted  Urethra: normal appearing urethra with no masses, tenderness or lesions  Vulva: mild vulvar atrophy, no lesions, tenderness  Vagina: mucosa moderately atophic and stenotic; no lesions or discharge  Cervix: stenotic appearing cervix without discharge or lesions  Uterus: uterus is normal size, shape, consistency and nontender  Adnexa: normal adnexa in size, nontender and no masses  RV: External Exam NormaI, No Rectal Masses, and Normal Sphincter tone  MUSCULOSKELETAL: Normal range of motion. No tenderness.  No cyanosis, clubbing, or edema.  2+ distal pulses. LYMPHATIC: No Axillary, Supraclavicular, or Inguinal Adenopathy.  Labs: Does with PCP  ASSESSMENT:   1. Encounter for routine gynecologic examination in Medicare patient   2. Encounter for screening mammogram for malignant neoplasm of breast   3. Vaginal atrophy   4. Vulvar atrophy   5. Depression screen      PLAN:   Tiffany [name] is a 73 y.o. G0P0000 female here today for her annual exam, doing well.  Pap: aged out, does not need; discussed rationale and pt desires to stop screening. Mammogram: ordered, scheduled 9/18 Colon: PCP  Labs: PCP PHQ-2 = 0 Healthy lifestyle modifications discussed: multivitamin, diet, exercise, sunscreen, tobacco and alcohol use. Emphasized importance of regular physical activity.  Calcium and Vit D  recommendation reviewed.  All questions answered to patient's satisfaction.   VVA: -Needs pediatric speculum, is becoming stenotic and mucosa very atrophic.  -Estrace  refilled today; encouraged vaginal use, not just vulvar; pt worried about cancer concerns, reviewed data together. She will consider.   Follow up 1 yr for annual, sooner prn.    Estil Mangle, DO [facility]

## 2024-06-14 ENCOUNTER — Encounter: Payer: Self-pay | Admitting: Obstetrics

## 2024-06-14 ENCOUNTER — Ambulatory Visit (INDEPENDENT_AMBULATORY_CARE_PROVIDER_SITE_OTHER): Admitting: Obstetrics

## 2024-06-14 VITALS — BP 163/80 | HR 67 | Ht 60.0 in | Wt 121.8 lb

## 2024-06-14 DIAGNOSIS — N952 Postmenopausal atrophic vaginitis: Secondary | ICD-10-CM | POA: Diagnosis not present

## 2024-06-14 DIAGNOSIS — N905 Atrophy of vulva: Secondary | ICD-10-CM | POA: Diagnosis not present

## 2024-06-14 DIAGNOSIS — Z01419 Encounter for gynecological examination (general) (routine) without abnormal findings: Secondary | ICD-10-CM

## 2024-06-14 DIAGNOSIS — Z1331 Encounter for screening for depression: Secondary | ICD-10-CM

## 2024-06-14 DIAGNOSIS — Z1231 Encounter for screening mammogram for malignant neoplasm of breast: Secondary | ICD-10-CM

## 2024-06-16 ENCOUNTER — Ambulatory Visit
Admission: RE | Admit: 2024-06-16 | Discharge: 2024-06-16 | Disposition: A | Discharge status: Home / Self Care | Source: Ambulatory Visit | Attending: Obstetrics | Admitting: Obstetrics

## 2024-06-16 DIAGNOSIS — Z1231 Encounter for screening mammogram for malignant neoplasm of breast: Secondary | ICD-10-CM | POA: Diagnosis present

## 2024-06-23 ENCOUNTER — Other Ambulatory Visit: Payer: Self-pay

## 2024-06-23 DIAGNOSIS — N952 Postmenopausal atrophic vaginitis: Secondary | ICD-10-CM

## 2024-06-23 MED ORDER — ESTRADIOL 0.1 MG/GM VA CREA: TOPICAL_CREAM | VAGINAL | 6 refills | Status: AC

## 2024-06-23 NOTE — Progress Notes (Signed)
 Patient saw Dr. Leigh 06/14/24, refills authorized.

## 2024-11-29 ENCOUNTER — Ambulatory Visit: Admitting: Dermatology

## 2024-12-13 ENCOUNTER — Ambulatory Visit: Admitting: Dermatology
